# Patient Record
Sex: Male | Born: 1970
Health system: Southern US, Community
[De-identification: ages and names within clinical notes are randomized; demographics above are authoritative.]

## PROBLEM LIST (undated history)

## (undated) DIAGNOSIS — K219 Gastro-esophageal reflux disease without esophagitis: Secondary | ICD-10-CM

## (undated) DIAGNOSIS — Z6828 Body mass index (BMI) 28.0-28.9, adult: Secondary | ICD-10-CM

## (undated) DIAGNOSIS — S42302A Unspecified fracture of shaft of humerus, left arm, initial encounter for closed fracture: Secondary | ICD-10-CM

## (undated) HISTORY — DX: Gastro-esophageal reflux disease without esophagitis: K21.9

## (undated) HISTORY — PX: UPPER GASTROINTESTINAL ENDOSCOPY: SHX188

## (undated) HISTORY — DX: Body mass index (BMI) 28.0-28.9, adult: Z68.28

---

## 1992-07-03 DIAGNOSIS — S42302A Unspecified fracture of shaft of humerus, left arm, initial encounter for closed fracture: Secondary | ICD-10-CM

## 1992-07-03 HISTORY — DX: Unspecified fracture of shaft of humerus, left arm, initial encounter for closed fracture: S42.302A

## 2005-11-13 ENCOUNTER — Encounter: Admission: RE | Admit: 2005-11-13 | Discharge: 2005-11-13 | Payer: Self-pay | Admitting: Orthopaedic Surgery

## 2008-07-15 ENCOUNTER — Ambulatory Visit: Payer: Self-pay | Admitting: Sports Medicine

## 2008-07-15 DIAGNOSIS — M79609 Pain in unspecified limb: Secondary | ICD-10-CM | POA: Insufficient documentation
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
## 2012-09-04 DIAGNOSIS — F10959 Alcohol use, unspecified with alcohol-induced psychotic disorder, unspecified: Secondary | ICD-10-CM | POA: Insufficient documentation

## 2012-09-04 DIAGNOSIS — K219 Gastro-esophageal reflux disease without esophagitis: Secondary | ICD-10-CM | POA: Insufficient documentation

## 2012-11-04 DIAGNOSIS — K5900 Constipation, unspecified: Secondary | ICD-10-CM | POA: Insufficient documentation

## 2012-11-04 DIAGNOSIS — E785 Hyperlipidemia, unspecified: Secondary | ICD-10-CM | POA: Insufficient documentation

## 2013-12-09 ENCOUNTER — Telehealth: Payer: Self-pay

## 2013-12-09 ENCOUNTER — Encounter: Payer: Self-pay | Admitting: Gastroenterology

## 2013-12-09 NOTE — Telephone Encounter (Signed)
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  They want to see you but you are out until august with appts.  Do you want me to double book you?  Barnett Applebaum called on behalf of the pt.  The wife's work number is [phone]

## 2013-12-10 NOTE — Telephone Encounter (Signed)
Yes, already doubled for tomorrow.  Please put him on for Tuesday (AM or PM double book).  Thanks

## 2013-12-10 NOTE — Telephone Encounter (Signed)
appt has been made pt's wife is aware

## 2013-12-16 ENCOUNTER — Ambulatory Visit (INDEPENDENT_AMBULATORY_CARE_PROVIDER_SITE_OTHER): Payer: 59 | Admitting: Gastroenterology

## 2013-12-16 ENCOUNTER — Encounter: Payer: Self-pay | Admitting: Gastroenterology

## 2013-12-16 VITALS — BP 110/80 | HR 60 | Ht 71.75 in | Wt 189.5 lb

## 2013-12-16 DIAGNOSIS — R198 Other specified symptoms and signs involving the digestive system and abdomen: Secondary | ICD-10-CM

## 2013-12-16 DIAGNOSIS — R1310 Dysphagia, unspecified: Secondary | ICD-10-CM

## 2013-12-16 DIAGNOSIS — F458 Other somatoform disorders: Secondary | ICD-10-CM

## 2013-12-16 DIAGNOSIS — R0989 Other specified symptoms and signs involving the circulatory and respiratory systems: Secondary | ICD-10-CM

## 2013-12-16 MED ORDER — PANTOPRAZOLE SODIUM 40 MG PO TBEC: 40.0000 mg | DELAYED_RELEASE_TABLET | Freq: Two times a day (BID) | ORAL | Status: DC

## 2013-12-16 MED ORDER — PANTOPRAZOLE SODIUM 40 MG PO TBEC: 40.0000 mg | DELAYED_RELEASE_TABLET | Freq: Every day | ORAL | Status: DC

## 2013-12-16 NOTE — Patient Instructions (Signed)
New prescription for protonix twice daily (20-30 min before BF and dinner meals). You will be set up for an upper endoscopy at [facility] (moderate sedation OK) for odynophagia, dysphagia, glubus.

## 2013-12-16 NOTE — Progress Notes (Signed)
HPI: This is a  very pleasant 43 year old man whom I am meeting for the first time today. His wife works as a Chief Executive Officer for the breast cancer program here in town.   Lump in throat, globus.  Discomfort.  Some short pains.  Has been about 4 months.  No NSAIDs.  No dysphagia at all.  Weight has been stable.  Has been taking nexium for years for pyrosis, stopped and had rebound symptoms.  Takes tums periodically.    Pyrosis, sour taste in his mouth in AMs especially.  About a month started 20mg  nexium in AM, usually before BF.  Not much caffeine, except in past month or so.  2 drinks daily.  Currently not smoking.  He has not been on any antibiotics     Review of systems: Pertinent positive and negative review of systems were noted in the above HPI section. Complete review of systems was performed and was otherwise normal.    Past Medical History  Diagnosis Date  . GERD (gastroesophageal reflux disease)     History reviewed. No pertinent past surgical history.  Current Outpatient Prescriptions  Medication Sig Dispense Refill  . calcium carbonate (TUMS EX) 750 MG chewable tablet Chew 1 tablet by mouth as needed for heartburn.      . Esomeprazole Magnesium (NEXIUM 24HR PO) Take 20 mg by mouth daily.      . IODINE STRONG, LUGOLS, PO Take 2 mLs by mouth daily.      . Multiple Vitamin (MULTIVITAMIN) tablet Take 1 tablet by mouth daily.       No current facility-administered medications for this visit.    Allergies as of 12/16/2013  . (No Known Allergies)    Family History  Problem Relation Age of Onset  . Cervical cancer Mother   . Stroke Paternal Grandmother   . Lung cancer Paternal Grandfather   . Heart disease Maternal Grandmother   . Stroke Maternal Grandfather     History   Social History  . Marital Status: Married    Spouse Name: N/A    Number of Children: 2  . Years of Education: N/A   Occupational History  . HR Manager    Social  History Main Topics  . Smoking status: Former Smoker    Types: Cigarettes    Quit date: 10/05/2013  . Smokeless tobacco: Never Used  . Alcohol Use: Yes     Comment: 2 per day  . Drug Use: No  . Sexual Activity: Not on file   Other Topics Concern  . Not on file   Social History Narrative  . No narrative on file       Physical Exam: BP 110/80  Pulse 60  Ht 5' 11.75" (1.822 m)  Wt 189 lb 8 oz (85.957 kg)  BMI 25.89 kg/m2 Constitutional: generally well-appearing Psychiatric: alert and oriented x3 Eyes: extraocular movements intact Mouth: oral pharynx moist, no lesions; he has large bilateral tonsils Neck: supple no lymphadenopathy Cardiovascular: heart regular rate and rhythm Lungs: clear to auscultation bilaterally Abdomen: soft, nontender, nondistended, no obvious ascites, no peritoneal signs, normal bowel sounds Extremities: no lower extremity edema bilaterally Skin: no lesions on visible extremities    Assessment and plan: 43 y.o. male with  4 months globus, odynophagia, chronic heartburn   he really has no dysphagia and no weight loss. My suspicion for neoplasm is low. I suspect this is GERD related. He is going to start twice-daily proton pump inhibitor for now. We will proceed with  EGD later this week as well. If this is indeed GERD related; B2 keep a month twice daily for 4-6 weeks and then hopefully back down to once daily proton pump inhibitor.

## 2013-12-18 ENCOUNTER — Encounter (HOSPITAL_COMMUNITY): Payer: Self-pay

## 2013-12-18 ENCOUNTER — Ambulatory Visit (HOSPITAL_COMMUNITY)
Admission: RE | Admit: 2013-12-18 | Discharge: 2013-12-18 | Disposition: A | Discharge status: Home / Self Care | Payer: 59 | Source: Ambulatory Visit | Attending: Gastroenterology | Admitting: Gastroenterology

## 2013-12-18 ENCOUNTER — Encounter (HOSPITAL_COMMUNITY)
Admission: RE | Disposition: A | Discharge status: Home / Self Care | Payer: Self-pay | Source: Ambulatory Visit | Attending: Gastroenterology

## 2013-12-18 DIAGNOSIS — R0989 Other specified symptoms and signs involving the circulatory and respiratory systems: Secondary | ICD-10-CM

## 2013-12-18 DIAGNOSIS — F458 Other somatoform disorders: Secondary | ICD-10-CM | POA: Insufficient documentation

## 2013-12-18 DIAGNOSIS — R198 Other specified symptoms and signs involving the digestive system and abdomen: Secondary | ICD-10-CM

## 2013-12-18 DIAGNOSIS — R1310 Dysphagia, unspecified: Secondary | ICD-10-CM

## 2013-12-18 DIAGNOSIS — R12 Heartburn: Secondary | ICD-10-CM | POA: Insufficient documentation

## 2013-12-18 DIAGNOSIS — Z87891 Personal history of nicotine dependence: Secondary | ICD-10-CM | POA: Insufficient documentation

## 2013-12-18 DIAGNOSIS — Z79899 Other long term (current) drug therapy: Secondary | ICD-10-CM | POA: Insufficient documentation

## 2013-12-18 DIAGNOSIS — J351 Hypertrophy of tonsils: Secondary | ICD-10-CM | POA: Insufficient documentation

## 2013-12-18 DIAGNOSIS — K219 Gastro-esophageal reflux disease without esophagitis: Secondary | ICD-10-CM | POA: Insufficient documentation

## 2013-12-18 HISTORY — PX: ESOPHAGOGASTRODUODENOSCOPY: SHX5428

## 2013-12-18 HISTORY — DX: Unspecified fracture of shaft of humerus, left arm, initial encounter for closed fracture: S42.302A

## 2013-12-18 SURGERY — EGD (ESOPHAGOGASTRODUODENOSCOPY)
Anesthesia: Moderate Sedation

## 2013-12-18 MED ORDER — BUTAMBEN-TETRACAINE-BENZOCAINE 2-2-14 % EX AERO
INHALATION_SPRAY | CUTANEOUS | Status: DC | PRN
  Administered 2013-12-18: 2 via TOPICAL

## 2013-12-18 MED ORDER — FENTANYL CITRATE 0.05 MG/ML IJ SOLN
INTRAMUSCULAR | Status: AC
  Filled 2013-12-18: qty 2

## 2013-12-18 MED ORDER — MIDAZOLAM HCL 10 MG/2ML IJ SOLN
INTRAMUSCULAR | Status: AC
  Filled 2013-12-18: qty 2

## 2013-12-18 MED ORDER — SODIUM CHLORIDE 0.9 % IV SOLN
INTRAVENOUS | Status: DC
  Administered 2013-12-18: 14:00:00 via INTRAVENOUS

## 2013-12-18 MED ORDER — MIDAZOLAM HCL 10 MG/2ML IJ SOLN
INTRAMUSCULAR | Status: DC | PRN
  Administered 2013-12-18 (×3): 2 mg via INTRAVENOUS
  Administered 2013-12-18: 1.5 mg via INTRAVENOUS

## 2013-12-18 MED ORDER — FENTANYL CITRATE 0.05 MG/ML IJ SOLN
INTRAMUSCULAR | Status: DC | PRN
  Administered 2013-12-18 (×2): 25 ug via INTRAVENOUS
  Administered 2013-12-18: 15 ug via INTRAVENOUS
  Administered 2013-12-18: 10 ug via INTRAVENOUS

## 2013-12-18 MED ORDER — DIPHENHYDRAMINE HCL 50 MG/ML IJ SOLN
INTRAMUSCULAR | Status: AC
  Filled 2013-12-18: qty 1

## 2013-12-18 NOTE — Discharge Instructions (Signed)
Esophagogastroduodenoscopy °Care After °Refer to this sheet in the next few weeks. These instructions provide you with information on caring for yourself after your procedure. Your caregiver may also give you more specific instructions. Your treatment has been planned according to current medical practices, but problems sometimes occur. Call your caregiver if you have any problems or questions after your procedure.  °HOME CARE INSTRUCTIONS °· Do not eat or drink anything until the numbing medicine (local anesthetic) has worn off and your gag reflex has returned. You will know that the local anesthetic has worn off when you can swallow comfortably. °· Do not drive for 12 hours after the procedure or as directed by your caregiver. °· Only take medicines as directed by your caregiver. °SEEK MEDICAL CARE IF:  °· You cannot stop coughing. °· You are not urinating at all or less than usual. °SEEK IMMEDIATE MEDICAL CARE IF: °· You have difficulty swallowing. °· You cannot eat or drink. °· You have worsening throat or chest pain. °· You have dizziness, lightheadedness, or you faint. °· You have nausea or vomiting. °· You have chills. °· You have a fever. °· You have severe abdominal pain. °· You have black, tarry, or bloody stools. °Document Released: 06/05/2012 Document Reviewed: 06/05/2012 °ExitCare® Patient Information ©2015 [employer]. This information is not intended to replace advice given to you by your health care provider. Make sure you discuss any questions you have with your health care provider. ° ° °Conscious Sedation, Adult, Care After °Refer to this sheet in the next few weeks. These instructions provide you with information on caring for yourself after your procedure. Your health care provider may also give you more specific instructions. Your treatment has been planned according to current medical practices, but problems sometimes occur. Call your health care provider if you have any problems or  questions after your procedure. °WHAT TO EXPECT AFTER THE PROCEDURE  °After your procedure: °· You may feel sleepy, clumsy, and have poor balance for several hours. °· Vomiting may occur if you eat too soon after the procedure. °HOME CARE INSTRUCTIONS °· Do not participate in any activities where you could become injured for at least 24 hours. Do not: °¨ Drive. °¨ Swim. °¨ Ride a bicycle. °¨ Operate heavy machinery. °¨ Cook. °¨ Use power tools. °¨ Climb ladders. °¨ Work from a high place. °· Do not make important decisions or sign legal documents until you are improved. °· If you vomit, drink water, juice, or soup when you can drink without vomiting. Make sure you have little or no nausea before eating solid foods. °· Only take over-the-counter or prescription medicines for pain, discomfort, or fever as directed by your health care provider. °· Make sure you and your family fully understand everything about the medicines given to you, including what side effects may occur. °· You should not drink alcohol, take sleeping pills, or take medicines that cause drowsiness for at least 24 hours. °· If you smoke, do not smoke without supervision. °· If you are feeling better, you may resume normal activities 24 hours after you were sedated. °· Keep all appointments with your health care provider. °SEEK MEDICAL CARE IF: °· Your skin is pale or bluish in color. °· You continue to feel nauseous or vomit. °· Your pain is getting worse and is not helped by medicine. °· You have bleeding or swelling. °· You are still sleepy or feeling clumsy after 24 hours. °SEEK IMMEDIATE MEDICAL CARE IF: °· You develop a   rash. °· You have difficulty breathing. °· You develop any type of allergic problem. °· You have a fever. °MAKE SURE YOU: °· Understand these instructions. °· Will watch your condition. °· Will get help right away if you are not doing well or get worse. °Document Released: 04/09/2013 Document Reviewed: 04/09/2013 °ExitCare®  Patient Information ©2015 [employer]. This information is not intended to replace advice given to you by your health care provider. Make sure you discuss any questions you have with your health care provider. ° °

## 2013-12-18 NOTE — Op Note (Addendum)
[facility] [place], [address]   ENDOSCOPY PROCEDURE REPORT  PATIENT: Christopher, Osborne  MR#: [id number] BIRTHDATE: 01-08-1971 , 43  yrs. old GENDER: Male ENDOSCOPIST: Milus Banister, MD PROCEDURE DATE:  12/18/2013 PROCEDURE:  EGD, diagnostic ASA CLASS:     Class I INDICATIONS:  globus, odynophagia. MEDICATIONS: Fentanyl 75 mcg IV and Versed 7 mg IV TOPICAL ANESTHETIC: Cetacaine Spray  DESCRIPTION OF PROCEDURE: After the risks benefits and alternatives of the procedure were thoroughly explained, informed consent was obtained.  The Pentax Gastroscope [id number] endoscope was introduced through the mouth and advanced to the second portion of the duodenum. Without limitations.  The instrument was slowly withdrawn as the mucosa was fully examined.    The upper, middle and distal third of the esophagus were carefully inspected and no abnormalities were noted.  The z-line was well seen at the GEJ.  The endoscope was pushed into the fundus which was normal including a retroflexed view.  The antrum, gastric body, first and second part of the duodenum were unremarkable.  Large tonsils bilaterally in oropharynx.          The scope was then withdrawn from the patient and the procedure completed. COMPLICATIONS: There were no complications.  ENDOSCOPIC IMPRESSION: Normal EGD Very large tonsils  RECOMMENDATIONS: Please continue twice daily PPI (protonix) for 4 weeks.  Call my office to report on your response towards the end of that time.  If improving, would likely decrease to once daily.  If no real improvement, then will consider ENT referral.  Your tonsils are very large and perhaps they are starting to cause feeling of lump in throat.   eSigned:  Milus Banister, MD 12/18/2013 3:06 PM Revised: 12/18/2013 3:06 PM

## 2013-12-18 NOTE — Interval H&P Note (Signed)
History and Physical Interval Note:  12/18/2013 2:28 PM  Christopher Osborne  has presented today for surgery, with the diagnosis of Odynophagia [787.20] Globus sensation [306.4]  The various methods of treatment have been discussed with the patient and family. After consideration of risks, benefits and other options for treatment, the patient has consented to  Procedure(s): ESOPHAGOGASTRODUODENOSCOPY (EGD) (N/A) as a surgical intervention .  The patient's history has been reviewed, patient examined, no change in status, stable for surgery.  I have reviewed the patient's chart and labs.  Questions were answered to the patient's satisfaction.     Milus Banister

## 2013-12-18 NOTE — H&P (View-Only) (Signed)
HPI: This is a  very pleasant 43 year old man whom I am meeting for the first time today. His wife works as a Chief Executive Officer for the breast cancer program here in town.   Lump in throat, globus.  Discomfort.  Some short pains.  Has been about 4 months.  No NSAIDs.  No dysphagia at all.  Weight has been stable.  Has been taking nexium for years for pyrosis, stopped and had rebound symptoms.  Takes tums periodically.    Pyrosis, sour taste in his mouth in AMs especially.  About a month started 20mg  nexium in AM, usually before BF.  Not much caffeine, except in past month or so.  2 drinks daily.  Currently not smoking.  He has not been on any antibiotics     Review of systems: Pertinent positive and negative review of systems were noted in the above HPI section. Complete review of systems was performed and was otherwise normal.    Past Medical History  Diagnosis Date  . GERD (gastroesophageal reflux disease)     History reviewed. No pertinent past surgical history.  Current Outpatient Prescriptions  Medication Sig Dispense Refill  . calcium carbonate (TUMS EX) 750 MG chewable tablet Chew 1 tablet by mouth as needed for heartburn.      . Esomeprazole Magnesium (NEXIUM 24HR PO) Take 20 mg by mouth daily.      . IODINE STRONG, LUGOLS, PO Take 2 mLs by mouth daily.      . Multiple Vitamin (MULTIVITAMIN) tablet Take 1 tablet by mouth daily.       No current facility-administered medications for this visit.    Allergies as of 12/16/2013  . (No Known Allergies)    Family History  Problem Relation Age of Onset  . Cervical cancer Mother   . Stroke Paternal Grandmother   . Lung cancer Paternal Grandfather   . Heart disease Maternal Grandmother   . Stroke Maternal Grandfather     History   Social History  . Marital Status: Married    Spouse Name: N/A    Number of Children: 2  . Years of Education: N/A   Occupational History  . HR Manager    Social  History Main Topics  . Smoking status: Former Smoker    Types: Cigarettes    Quit date: 10/05/2013  . Smokeless tobacco: Never Used  . Alcohol Use: Yes     Comment: 2 per day  . Drug Use: No  . Sexual Activity: Not on file   Other Topics Concern  . Not on file   Social History Narrative  . No narrative on file       Physical Exam: BP 110/80  Pulse 60  Ht 5' 11.75" (1.822 m)  Wt 189 lb 8 oz (85.957 kg)  BMI 25.89 kg/m2 Constitutional: generally well-appearing Psychiatric: alert and oriented x3 Eyes: extraocular movements intact Mouth: oral pharynx moist, no lesions; he has large bilateral tonsils Neck: supple no lymphadenopathy Cardiovascular: heart regular rate and rhythm Lungs: clear to auscultation bilaterally Abdomen: soft, nontender, nondistended, no obvious ascites, no peritoneal signs, normal bowel sounds Extremities: no lower extremity edema bilaterally Skin: no lesions on visible extremities    Assessment and plan: 43 y.o. male with  4 months globus, odynophagia, chronic heartburn   he really has no dysphagia and no weight loss. My suspicion for neoplasm is low. I suspect this is GERD related. He is going to start twice-daily proton pump inhibitor for now. We will proceed with  EGD later this week as well. If this is indeed GERD related; B2 keep a month twice daily for 4-6 weeks and then hopefully back down to once daily proton pump inhibitor.

## 2013-12-19 ENCOUNTER — Encounter (HOSPITAL_COMMUNITY): Payer: Self-pay | Admitting: Gastroenterology

## 2014-02-09 ENCOUNTER — Ambulatory Visit: Payer: Self-pay | Admitting: Gastroenterology

## 2015-09-02 DIAGNOSIS — L218 Other seborrheic dermatitis: Secondary | ICD-10-CM | POA: Diagnosis not present

## 2015-09-02 DIAGNOSIS — D225 Melanocytic nevi of trunk: Secondary | ICD-10-CM | POA: Diagnosis not present

## 2015-09-02 DIAGNOSIS — D2272 Melanocytic nevi of left lower limb, including hip: Secondary | ICD-10-CM | POA: Diagnosis not present

## 2015-09-02 DIAGNOSIS — D2262 Melanocytic nevi of left upper limb, including shoulder: Secondary | ICD-10-CM | POA: Diagnosis not present

## 2015-09-02 DIAGNOSIS — L918 Other hypertrophic disorders of the skin: Secondary | ICD-10-CM | POA: Diagnosis not present

## 2015-09-02 DIAGNOSIS — L814 Other melanin hyperpigmentation: Secondary | ICD-10-CM | POA: Diagnosis not present

## 2015-09-02 MED FILL — KETOCONAZOLE 2% SHAMPOO: 2 | 30 days supply | Qty: 120 | Fill #0

## 2015-11-10 DIAGNOSIS — S026 Fracture of unspecified part of body of mandible: Secondary | ICD-10-CM | POA: Diagnosis not present

## 2015-11-10 DIAGNOSIS — S02600A Fracture of unspecified part of body of mandible, initial encounter for closed fracture: Secondary | ICD-10-CM | POA: Diagnosis not present

## 2016-01-31 ENCOUNTER — Ambulatory Visit
Admission: EM | Admit: 2016-01-31 | Discharge: 2016-01-31 | Disposition: A | Discharge status: Home / Self Care | Payer: 59 | Attending: Family Medicine | Admitting: Family Medicine

## 2016-01-31 ENCOUNTER — Ambulatory Visit (INDEPENDENT_AMBULATORY_CARE_PROVIDER_SITE_OTHER): Payer: 59

## 2016-01-31 DIAGNOSIS — S61219A Laceration without foreign body of unspecified finger without damage to nail, initial encounter: Secondary | ICD-10-CM | POA: Diagnosis not present

## 2016-01-31 DIAGNOSIS — S61215A Laceration without foreign body of left ring finger without damage to nail, initial encounter: Secondary | ICD-10-CM | POA: Diagnosis not present

## 2016-01-31 MED ORDER — CEPHALEXIN 500 MG PO CAPS: 500.0000 mg | ORAL_CAPSULE | Freq: Four times a day (QID) | ORAL | 0 refills | Status: DC

## 2016-01-31 MED ORDER — TETANUS-DIPHTH-ACELL PERTUSSIS 5-2.5-18.5 LF-MCG/0.5 IM SUSP
0.5000 mL | Freq: Once | INTRAMUSCULAR | Status: AC
  Administered 2016-01-31: 0.5 mL via INTRAMUSCULAR

## 2016-01-31 MED FILL — CEPHALEXIN 500 MG CAPSULE: 500 | 5 days supply | Qty: 20 | Fill #0

## 2016-01-31 NOTE — ED Provider Notes (Signed)
[facility] ____________________________________________  Time seen: Approximately 2:15 PM  I have reviewed the triage vital signs and the nursing notes.   HISTORY  Chief Complaint Laceration (Left middle and ring finger)  HPI Christopher CRUZE is a 45 y.o. male  presents for evaluation of laceration to left third and fourth fingers sustained just prior to arrival. Patient reports this occurred at a friend's house. Patient reports that he was helping a friend put up a piece of stainless steel. . Patient reports the stainless steel had recently been cut and was warm to touch. Patient reports the piece of stainless steel slid down along his third and fourth fingers causing laceration. Denies crush injury. Denies any other pain or injury. Patient reports last tetanus immunization was questionably 5 or 6 years ago and states he is not fully sure.  States pain to the third and fourth fingers are mild to moderate and mostly with direct palpation. Denies concerns of foreign bodies. Denies any other pain or injury. Denies fall to the ground. Denies any Worker's Compensation injury. Reports right hand dominant.   Past Medical History:  Diagnosis Date  . Arm fracture, left 1994  . GERD (gastroesophageal reflux disease)     Patient Active Problem List   Diagnosis Date Noted  . FOOT PAIN, RIGHT 07/15/2008    Past Surgical History:  Procedure Laterality Date  . ESOPHAGOGASTRODUODENOSCOPY N/A 12/18/2013   Procedure: ESOPHAGOGASTRODUODENOSCOPY (EGD);  Surgeon: Milus Banister, MD;  Location: [facility];  Service: Endoscopy;  Laterality: N/A;   No current facility-administered medications for this encounter.   Current Outpatient Prescriptions:  .  calcium carbonate (TUMS EX) 750 MG chewable tablet, Chew 1 tablet by mouth as needed for heartburn., Disp: , Rfl:  .  Multiple Vitamin (MULTIVITAMIN) tablet, Take 1 tablet by mouth daily., Disp: , Rfl:  .  cephALEXin (KEFLEX) 500 MG  capsule, Take 1 capsule (500 mg total) by mouth 4 (four) times daily. For 5 days, Disp: 20 capsule, Rfl: 0 .  Esomeprazole Magnesium (NEXIUM 24HR PO), Take 20 mg by mouth daily., Disp: , Rfl:  .  IODINE STRONG, LUGOLS, PO, Take 2 mLs by mouth daily., Disp: , Rfl:  .  pantoprazole (PROTONIX) 40 MG tablet, Take 1 tablet (40 mg total) by mouth 2 (two) times daily., Disp: 90 tablet, Rfl: 3  Allergies Review of patient's allergies indicates no known allergies.  Family History  Problem Relation Age of Onset  . Cervical cancer Mother   . Stroke Paternal Grandmother   . Lung cancer Paternal Grandfather   . Heart disease Maternal Grandmother   . Stroke Maternal Grandfather     Social History Social History  Substance Use Topics  . Smoking status: Former Smoker    Types: Cigarettes    Quit date: 10/05/2013  . Smokeless tobacco: Never Used  . Alcohol use Yes     Comment: 2 per day    Review of Systems Constitutional: No fever/chills Eyes: No visual changes. ENT: No sore throat. Cardiovascular: Denies chest pain. Respiratory: Denies shortness of breath. Gastrointestinal: No abdominal pain.  No nausea, no vomiting.  No diarrhea.  No constipation. Genitourinary: Negative for dysuria. Musculoskeletal: Negative for back pain. Skin: Negative for rash. Neurological: Negative for headaches, focal weakness or numbness.  10-point ROS otherwise negative.  ____________________________________________   PHYSICAL EXAM:  VITAL SIGNS: ED Triage Vitals [01/31/16 1252]  Enc Vitals Group     BP (!) 154/93     Pulse Rate 83  Resp 18     Temp 98.5 F (36.9 C)     Temp Source Oral     SpO2 99 %     Weight 185 lb (83.9 kg)     Height 5\' 11"  (1.803 m)     Head Circumference      Peak Flow      Pain Score 5     Pain Loc      Pain Edu?      Excl. in [place]?     Constitutional: Alert and oriented. Well appearing and in no acute distress. Eyes: Conjunctivae are normal. PERRL. EOMI. ENT       Head: Normocephalic and atraumatic.      Nose: No congestion/rhinnorhea.      Mouth/Throat: Mucous membranes are moist. Cardiovascular: Normal rate, regular rhythm. Grossly normal heart sounds.  Good peripheral circulation. Respiratory: Normal respiratory effort without tachypnea nor retractions. Breath sounds are clear and equal bilaterally. No wheezes/rales/rhonchi.. Musculoskeletal:  Nontender with normal range of motion in all extremities. See skin below. Neurologic:  Normal speech and language. No gross focal neurologic deficits are appreciated. Speech is normal. No gait instability.  Skin:  Skin is warm, dry and intact. No rash noted. Except: Left fourth digit palmar aspect superficial skin avulsion 1.5 cm, mild tenderness to palpation, no foreign body visible, full range of motion, no motor or tendon deficit, sensation intact with normal capillary refill. No repair indicated. Except: Left third digit palmar aspect laceration present that is 2.5 cm superficial flap laceration and superficial abrasion, mild to moderate tenderness to palpation, no foreign body visible or palpated, full range of motion, no motor or tendon deficit, sensation intact with normal capillary refill.  Psychiatric: Mood and affect are normal. Speech and behavior are normal. Patient exhibits appropriate insight and judgment   ___________________________________________   LABS (all labs ordered are listed, but only abnormal results are displayed)  Labs Reviewed - No data to display  RADIOLOGY  Dg Hand Complete Left  Result Date: 01/31/2016 CLINICAL DATA:  Laceration of third and fourth fingers. EXAM: LEFT HAND - COMPLETE 3+ VIEW COMPARISON:  None. FINDINGS: There is no evidence of fracture or dislocation. There is no evidence of arthropathy or other focal bone abnormality. Soft tissues are unremarkable. IMPRESSION: Negative. Electronically Signed   By: Dorise Bullion III M.D   On: 01/31/2016 14:21    ____________________________________________   PROCEDURES Procedures  Procedure(s) performed:  Procedure explained and verbal consent obtained. Consent: Verbal consent obtained. Written consent not obtained. Risks and benefits: risks, benefits and alternatives were discussed Patient identity confirmed: verbally with patient and hospital-assigned identification number  Consent given by: patient   Laceration Repair Location: Left third digit Length: 2.5 cm Foreign bodies: no foreign bodies Tendon involvement: none Nerve involvement: none Preparation: Patient was prepped and draped in the usual sterile fashion. Anesthesia none Irrigation solution: saline and Betadine  Irrigation method: jet lavage Amount of cleaning: copious Repaired with 2 Steri-Strips. Surgical adhesive utilized to secure Steri-Strips. Sterile adhesive not applied directly to the wound. Approximation: loose Patient tolerate well. Wound well approximated post repair.  Antibiotic ointment and dressing applied.  Wound care instructions provided.  Observe for any signs of infection or other problems.    _________________________________________   INITIAL IMPRESSION / ASSESSMENT AND PLAN / ED COURSE  Pertinent labs & imaging results that were available during my care of the patient were reviewed by me and considered in my medical decision making (see chart for details).  Very well-appearing patient. No acute distress. Laceration to third and fourth digits post injury just prior to arrival. Left hand x-ray negative. Left third digit laceration no sutures indicated, repaired with Steri-Strips as above. Left fourth digit laceration repair indicated. Tetanus immunization updated. Dressing applied. Counseled regarding wound care, cleaning and monitoring at home. Discussed in detail with patient as well as a dirty object, will give Rx for oral cephalexin.Discussed indication, risks and benefits of medications with  patient. Discussed return indications.  Discussed follow up with Primary care physician this week. Discussed follow up and return parameters including no resolution or any worsening concerns. Patient verbalized understanding and agreed to plan.   ____________________________________________   FINAL CLINICAL IMPRESSION(S) / ED DIAGNOSES  Final diagnoses:  Finger laceration, initial encounter     Discharge Medication List as of 01/31/2016  2:30 PM    START taking these medications   Details  cephALEXin (KEFLEX) 500 MG capsule Take 1 capsule (500 mg total) by mouth 4 (four) times daily. For 5 days, Starting Mon 01/31/2016, Normal        Note: This dictation was prepared with Dragon dictation along with smaller phrase technology. Any transcriptional errors that result from this process are unintentional.    Clinical Course      Marylene Land, NP 01/31/16 [phone]

## 2016-01-31 NOTE — ED Triage Notes (Signed)
Patient presents with a laceration to his middle and ring , he was helping a friend with stainless steel and it fell and slid down his fingers and he states it burns.

## 2016-05-17 DIAGNOSIS — Z125 Encounter for screening for malignant neoplasm of prostate: Secondary | ICD-10-CM | POA: Diagnosis not present

## 2016-05-17 DIAGNOSIS — Z0000 Encounter for general adult medical examination without abnormal findings: Secondary | ICD-10-CM | POA: Diagnosis not present

## 2016-05-23 DIAGNOSIS — M5126 Other intervertebral disc displacement, lumbar region: Secondary | ICD-10-CM | POA: Diagnosis not present

## 2016-05-23 DIAGNOSIS — M9903 Segmental and somatic dysfunction of lumbar region: Secondary | ICD-10-CM | POA: Diagnosis not present

## 2016-05-23 DIAGNOSIS — M791 Myalgia: Secondary | ICD-10-CM | POA: Diagnosis not present

## 2016-05-23 DIAGNOSIS — M9904 Segmental and somatic dysfunction of sacral region: Secondary | ICD-10-CM | POA: Diagnosis not present

## 2016-05-23 DIAGNOSIS — M5442 Lumbago with sciatica, left side: Secondary | ICD-10-CM | POA: Diagnosis not present

## 2016-05-23 DIAGNOSIS — M9905 Segmental and somatic dysfunction of pelvic region: Secondary | ICD-10-CM | POA: Diagnosis not present

## 2016-05-31 DIAGNOSIS — M9905 Segmental and somatic dysfunction of pelvic region: Secondary | ICD-10-CM | POA: Diagnosis not present

## 2016-05-31 DIAGNOSIS — M5126 Other intervertebral disc displacement, lumbar region: Secondary | ICD-10-CM | POA: Diagnosis not present

## 2016-05-31 DIAGNOSIS — M791 Myalgia: Secondary | ICD-10-CM | POA: Diagnosis not present

## 2016-05-31 DIAGNOSIS — M9904 Segmental and somatic dysfunction of sacral region: Secondary | ICD-10-CM | POA: Diagnosis not present

## 2016-05-31 DIAGNOSIS — M5442 Lumbago with sciatica, left side: Secondary | ICD-10-CM | POA: Diagnosis not present

## 2016-05-31 DIAGNOSIS — M9903 Segmental and somatic dysfunction of lumbar region: Secondary | ICD-10-CM | POA: Diagnosis not present

## 2016-06-01 DIAGNOSIS — M545 Low back pain, unspecified: Secondary | ICD-10-CM | POA: Insufficient documentation

## 2016-06-01 DIAGNOSIS — Z23 Encounter for immunization: Secondary | ICD-10-CM | POA: Diagnosis not present

## 2016-06-01 DIAGNOSIS — E784 Other hyperlipidemia: Secondary | ICD-10-CM | POA: Diagnosis not present

## 2016-06-01 DIAGNOSIS — Z6825 Body mass index (BMI) 25.0-25.9, adult: Secondary | ICD-10-CM | POA: Diagnosis not present

## 2016-06-01 DIAGNOSIS — Z1389 Encounter for screening for other disorder: Secondary | ICD-10-CM | POA: Diagnosis not present

## 2016-06-01 DIAGNOSIS — Z720 Tobacco use: Secondary | ICD-10-CM | POA: Diagnosis not present

## 2016-06-01 DIAGNOSIS — K219 Gastro-esophageal reflux disease without esophagitis: Secondary | ICD-10-CM | POA: Diagnosis not present

## 2016-06-01 DIAGNOSIS — Z0000 Encounter for general adult medical examination without abnormal findings: Secondary | ICD-10-CM | POA: Diagnosis not present

## 2016-06-01 DIAGNOSIS — Z125 Encounter for screening for malignant neoplasm of prostate: Secondary | ICD-10-CM | POA: Diagnosis not present

## 2016-06-01 MED FILL — predniSONE 5 MG (21) TBPK: 5 | 6 days supply | Qty: 21 | Fill #0

## 2016-06-02 DIAGNOSIS — M9904 Segmental and somatic dysfunction of sacral region: Secondary | ICD-10-CM | POA: Diagnosis not present

## 2016-06-02 DIAGNOSIS — M5126 Other intervertebral disc displacement, lumbar region: Secondary | ICD-10-CM | POA: Diagnosis not present

## 2016-06-02 DIAGNOSIS — M5442 Lumbago with sciatica, left side: Secondary | ICD-10-CM | POA: Diagnosis not present

## 2016-06-02 DIAGNOSIS — M9903 Segmental and somatic dysfunction of lumbar region: Secondary | ICD-10-CM | POA: Diagnosis not present

## 2016-06-02 DIAGNOSIS — M791 Myalgia: Secondary | ICD-10-CM | POA: Diagnosis not present

## 2016-06-02 DIAGNOSIS — M9905 Segmental and somatic dysfunction of pelvic region: Secondary | ICD-10-CM | POA: Diagnosis not present

## 2016-06-07 ENCOUNTER — Other Ambulatory Visit: Payer: Self-pay | Admitting: Internal Medicine

## 2016-06-07 DIAGNOSIS — M9903 Segmental and somatic dysfunction of lumbar region: Secondary | ICD-10-CM | POA: Diagnosis not present

## 2016-06-07 DIAGNOSIS — M5442 Lumbago with sciatica, left side: Secondary | ICD-10-CM | POA: Diagnosis not present

## 2016-06-07 DIAGNOSIS — M791 Myalgia: Secondary | ICD-10-CM | POA: Diagnosis not present

## 2016-06-07 DIAGNOSIS — M9904 Segmental and somatic dysfunction of sacral region: Secondary | ICD-10-CM | POA: Diagnosis not present

## 2016-06-07 DIAGNOSIS — M9905 Segmental and somatic dysfunction of pelvic region: Secondary | ICD-10-CM | POA: Diagnosis not present

## 2016-06-07 DIAGNOSIS — M5126 Other intervertebral disc displacement, lumbar region: Secondary | ICD-10-CM | POA: Diagnosis not present

## 2016-06-08 MED FILL — CEFDINIR 300 MG CAPSULE: 300 | 7 days supply | Qty: 14 | Fill #0

## 2016-06-09 ENCOUNTER — Other Ambulatory Visit (HOSPITAL_COMMUNITY): Payer: Self-pay | Admitting: Internal Medicine

## 2016-06-12 ENCOUNTER — Other Ambulatory Visit (HOSPITAL_COMMUNITY): Payer: Self-pay | Admitting: Internal Medicine

## 2016-06-12 DIAGNOSIS — M545 Low back pain, unspecified: Secondary | ICD-10-CM

## 2016-06-16 ENCOUNTER — Ambulatory Visit (HOSPITAL_COMMUNITY): Payer: 59

## 2016-07-28 DIAGNOSIS — J111 Influenza due to unidentified influenza virus with other respiratory manifestations: Secondary | ICD-10-CM | POA: Diagnosis not present

## 2016-07-28 DIAGNOSIS — R05 Cough: Secondary | ICD-10-CM | POA: Diagnosis not present

## 2016-09-04 ENCOUNTER — Ambulatory Visit (HOSPITAL_COMMUNITY)
Admission: RE | Admit: 2016-09-04 | Discharge: 2016-09-04 | Disposition: A | Discharge status: Home / Self Care | Payer: 59 | Source: Ambulatory Visit | Attending: Internal Medicine | Admitting: Internal Medicine

## 2016-09-04 DIAGNOSIS — M545 Low back pain, unspecified: Secondary | ICD-10-CM

## 2016-09-04 DIAGNOSIS — M5126 Other intervertebral disc displacement, lumbar region: Secondary | ICD-10-CM | POA: Insufficient documentation

## 2016-09-04 DIAGNOSIS — M5137 Other intervertebral disc degeneration, lumbosacral region: Secondary | ICD-10-CM | POA: Insufficient documentation

## 2016-09-05 DIAGNOSIS — M48061 Spinal stenosis, lumbar region without neurogenic claudication: Secondary | ICD-10-CM | POA: Insufficient documentation

## 2016-09-12 DIAGNOSIS — Z6825 Body mass index (BMI) 25.0-25.9, adult: Secondary | ICD-10-CM | POA: Diagnosis not present

## 2016-09-12 DIAGNOSIS — M5416 Radiculopathy, lumbar region: Secondary | ICD-10-CM | POA: Diagnosis not present

## 2016-09-12 DIAGNOSIS — R030 Elevated blood-pressure reading, without diagnosis of hypertension: Secondary | ICD-10-CM | POA: Diagnosis not present

## 2016-09-13 ENCOUNTER — Other Ambulatory Visit: Payer: Self-pay | Admitting: Student

## 2016-09-13 DIAGNOSIS — M5416 Radiculopathy, lumbar region: Secondary | ICD-10-CM

## 2016-09-19 ENCOUNTER — Ambulatory Visit
Admission: RE | Admit: 2016-09-19 | Discharge: 2016-09-19 | Disposition: A | Discharge status: Home / Self Care | Payer: 59 | Source: Ambulatory Visit | Attending: Student | Admitting: Student

## 2016-09-19 DIAGNOSIS — M48061 Spinal stenosis, lumbar region without neurogenic claudication: Secondary | ICD-10-CM | POA: Diagnosis not present

## 2016-09-19 DIAGNOSIS — M5416 Radiculopathy, lumbar region: Secondary | ICD-10-CM

## 2016-09-19 MED ORDER — METHYLPREDNISOLONE ACETATE 40 MG/ML INJ SUSP (RADIOLOG
120.0000 mg | Freq: Once | INTRAMUSCULAR | Status: AC
  Administered 2016-09-19: 120 mg via EPIDURAL

## 2016-09-19 MED ORDER — IOPAMIDOL (ISOVUE-M 200) INJECTION 41%
1.0000 mL | Freq: Once | INTRAMUSCULAR | Status: AC
  Administered 2016-09-19: 1 mL via EPIDURAL

## 2016-09-19 NOTE — Discharge Instructions (Signed)

## 2016-10-10 DIAGNOSIS — M5416 Radiculopathy, lumbar region: Secondary | ICD-10-CM | POA: Diagnosis not present

## 2017-01-26 ENCOUNTER — Emergency Department (HOSPITAL_COMMUNITY)
Admission: EM | Admit: 2017-01-26 | Discharge: 2017-01-27 | Disposition: A | Discharge status: Home / Self Care | Payer: 59 | Attending: Emergency Medicine | Admitting: Emergency Medicine

## 2017-01-26 ENCOUNTER — Encounter (HOSPITAL_COMMUNITY): Payer: Self-pay | Admitting: Emergency Medicine

## 2017-01-26 DIAGNOSIS — Y92538 Other ambulatory health services establishments as the place of occurrence of the external cause: Secondary | ICD-10-CM | POA: Insufficient documentation

## 2017-01-26 DIAGNOSIS — Z87891 Personal history of nicotine dependence: Secondary | ICD-10-CM | POA: Insufficient documentation

## 2017-01-26 DIAGNOSIS — S80862A Insect bite (nonvenomous), left lower leg, initial encounter: Secondary | ICD-10-CM | POA: Diagnosis not present

## 2017-01-26 DIAGNOSIS — W57XXXA Bitten or stung by nonvenomous insect and other nonvenomous arthropods, initial encounter: Secondary | ICD-10-CM | POA: Diagnosis not present

## 2017-01-26 DIAGNOSIS — Y9389 Activity, other specified: Secondary | ICD-10-CM | POA: Diagnosis not present

## 2017-01-26 DIAGNOSIS — M79605 Pain in left leg: Secondary | ICD-10-CM | POA: Diagnosis not present

## 2017-01-26 DIAGNOSIS — L299 Pruritus, unspecified: Secondary | ICD-10-CM | POA: Insufficient documentation

## 2017-01-26 DIAGNOSIS — R208 Other disturbances of skin sensation: Secondary | ICD-10-CM | POA: Diagnosis present

## 2017-01-26 DIAGNOSIS — Y990 Civilian activity done for income or pay: Secondary | ICD-10-CM | POA: Insufficient documentation

## 2017-01-26 DIAGNOSIS — T7840XA Allergy, unspecified, initial encounter: Secondary | ICD-10-CM | POA: Diagnosis not present

## 2017-01-26 DIAGNOSIS — Z91038 Other insect allergy status: Secondary | ICD-10-CM | POA: Diagnosis not present

## 2017-01-26 MED ORDER — PREDNISONE 10 MG PO TABS: 40.0000 mg | ORAL_TABLET | Freq: Every day | ORAL | 0 refills | Status: AC

## 2017-01-26 MED ORDER — EPINEPHRINE 0.3 MG/0.3ML IJ SOAJ: 0.3000 mg | Freq: Once | INTRAMUSCULAR | 1 refills | Status: AC

## 2017-01-26 MED ORDER — METHYLPREDNISOLONE SODIUM SUCC 125 MG IJ SOLR
125.0000 mg | Freq: Once | INTRAMUSCULAR | Status: AC
  Administered 2017-01-26: 125 mg via INTRAVENOUS
  Filled 2017-01-26: qty 2

## 2017-01-26 MED ORDER — SODIUM CHLORIDE 0.9 % IV BOLUS (SEPSIS)
1000.0000 mL | Freq: Once | INTRAVENOUS | Status: AC
  Administered 2017-01-26: 1000 mL via INTRAVENOUS

## 2017-01-26 MED ORDER — FAMOTIDINE IN NACL 20-0.9 MG/50ML-% IV SOLN
20.0000 mg | Freq: Once | INTRAVENOUS | Status: AC
  Administered 2017-01-26: 20 mg via INTRAVENOUS
  Filled 2017-01-26: qty 50

## 2017-01-26 MED ORDER — TRAMADOL HCL 50 MG PO TABS: 50.0000 mg | ORAL_TABLET | Freq: Four times a day (QID) | ORAL | 0 refills | Status: DC | PRN

## 2017-01-26 MED ORDER — IBUPROFEN 400 MG PO TABS: 800.0000 mg | ORAL_TABLET | ORAL | Status: DC | PRN

## 2017-01-26 MED ORDER — TRAMADOL HCL 50 MG PO TABS
50.0000 mg | ORAL_TABLET | Freq: Once | ORAL | Status: AC
  Administered 2017-01-26: 50 mg via ORAL
  Filled 2017-01-26: qty 1

## 2017-01-26 NOTE — Discharge Instructions (Signed)

## 2017-01-26 NOTE — ED Notes (Signed)
Pt stung by several yellow jackets approx 1 hr ago. Pt took 2 benadryl at home but still feeling a "warm burning" sensation all over body. Also reports he feels like his tongue is swollen. Long MD aware.

## 2017-01-26 NOTE — ED Notes (Signed)
Pt verbalized understanding discharge instructions and denies any further needs or questions at this time. VS stable, ambulatory and steady gait.   

## 2017-01-26 NOTE — ED Provider Notes (Signed)
Emergency Department Provider Note   I have reviewed the triage vital signs and the nursing notes.   HISTORY  Chief Complaint Allergic Reaction   HPI MERLON ALCORTA is a 46 y.o. male with PMH of GERD presents to the emergency department for evaluation of diffuse burning sensation, itching, pain in the left leg after multiple yellowjacket stings. 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 He had several low jackets stinging him in the leg. Shortly after he developed diffuse burning sensation over his entire body with itching and hives. No difficulty breathing. Denies any tongue swelling. He did feel some itchy sensation in his tongue. No radiation of symptoms.   Past Medical History:  Diagnosis Date  . Arm fracture, left 1994  . GERD (gastroesophageal reflux disease)     Patient Active Problem List   Diagnosis Date Noted  . FOOT PAIN, RIGHT 07/15/2008    Past Surgical History:  Procedure Laterality Date  . ESOPHAGOGASTRODUODENOSCOPY N/A 12/18/2013   Procedure: ESOPHAGOGASTRODUODENOSCOPY (EGD);  Surgeon: Milus Banister, MD;  Location: [facility];  Service: Endoscopy;  Laterality: N/A;    Current Outpatient Rx  . Order #: [id number] Class: Historical Med  . Order #: [id number] Class: Normal  . Order #: [id number] Class: Print  . Order #: [id number] Class: Historical Med  . Order #: [id number] Class: Normal  . [START ON 01/27/2017] Order #: [id number] Class: Print  . Order #: [id number] Class: Print    Allergies Patient has no known allergies.  Family History  Problem Relation Age of Onset  . Cervical cancer Mother   . Stroke Paternal Grandmother   . Lung cancer Paternal Grandfather   . Heart disease Maternal Grandmother   . Stroke Maternal Grandfather     Social History Social History  Substance Use Topics  . Smoking status: Former Smoker    Types: Cigarettes    Quit date: 10/05/2013  . Smokeless tobacco: Never Used  . Alcohol  use Yes     Comment: 2 per day    Review of Systems  Constitutional: No fever/chills Eyes: No visual changes. ENT: No sore throat. Cardiovascular: Denies chest pain. Respiratory: Denies shortness of breath. Gastrointestinal: No abdominal pain.  No nausea, no vomiting.  No diarrhea.  No constipation. Genitourinary: Negative for dysuria. Musculoskeletal: Negative for back pain. Skin: Negative for rash. Neurological: Negative for headaches, focal weakness or numbness.  10-point ROS otherwise negative.  ____________________________________________   PHYSICAL EXAM:  VITAL SIGNS: ED Triage Vitals  Enc Vitals Group     BP 01/26/17 2021 (!) 136/93     Pulse Rate 01/26/17 2021 97     Resp 01/26/17 2021 18     Temp 01/26/17 2021 98.3 F (36.8 C)     Temp Source 01/26/17 2021 Oral     SpO2 01/26/17 2021 97 %     Weight 01/26/17 2020 185 lb (83.9 kg)     Height 01/26/17 2020 6' (1.829 m)     Pain Score 01/26/17 2020 7    Constitutional: Alert and oriented. Well appearing and in no acute distress. Eyes: Conjunctivae are normal.  Head: Atraumatic. Nose: No congestion/rhinnorhea. Mouth/Throat: Mucous membranes are moist.  Oropharynx non-erythematous. No PTA. No tongue swelling.  Neck: No stridor.   Cardiovascular: Normal rate, regular rhythm. Good peripheral circulation. Grossly normal heart sounds.   Respiratory: Normal respiratory effort.  No retractions. Lungs CTAB. Gastrointestinal: Soft and nontender. No distention.  Musculoskeletal: No lower extremity tenderness nor edema. No  gross deformities of extremities. Neurologic:  Normal speech and language. No gross focal neurologic deficits are appreciated.  Skin:  Skin is warm, dry and intact. No rash noted.  ____________________________________________   PROCEDURES  Procedure(s) performed:   Procedures  None  ____________________________________________   INITIAL IMPRESSION / ASSESSMENT AND PLAN / ED  COURSE  Pertinent labs & imaging results that were available during my care of the patient were reviewed by me and considered in my medical decision making (see chart for details).  Patient resents to the emergency department for evaluation of diffuse hives and pain in the leg after yellowjacket stings. No evidence of anaphylaxis at this time. Plan for steroids, IV fluid, Pepcid. Patient took 50 mg of Benadryl prior to arrival. No indication for epinephrine at this time.   11:00 PM Patient feeling much better. Plan for discharge with EpiPen, steroid burst, plan for PCP follow-up.  At this time, I do not feel there is any life-threatening condition present. I have reviewed and discussed all results (EKG, imaging, lab, urine as appropriate), exam findings with patient. I have reviewed nursing notes and appropriate previous records.  I feel the patient is safe to be discharged home without further emergent workup. Discussed usual and customary return precautions. Patient and family (if present) verbalize understanding and are comfortable with this plan.  Patient will follow-up with their primary care provider. If they do not have a primary care provider, information for follow-up has been provided to them. All questions have been answered.  ____________________________________________  FINAL CLINICAL IMPRESSION(S) / ED DIAGNOSES  Final diagnoses:  Allergic reaction, initial encounter     MEDICATIONS GIVEN DURING THIS VISIT:  Medications  traMADol (ULTRAM) tablet 50 mg (not administered)  ibuprofen (ADVIL,MOTRIN) tablet 800 mg (not administered)  sodium chloride 0.9 % bolus 1,000 mL (1,000 mLs Intravenous New Bag/Given 01/26/17 2110)  methylPREDNISolone sodium succinate (SOLU-MEDROL) 125 mg/2 mL injection 125 mg (125 mg Intravenous Given 01/26/17 2116)  famotidine (PEPCID) IVPB 20 mg premix (20 mg Intravenous New Bag/Given 01/26/17 2116)     NEW OUTPATIENT MEDICATIONS STARTED DURING THIS  VISIT:  New Prescriptions   EPINEPHRINE (EPIPEN 2-PAK) 0.3 MG/0.3 ML IJ SOAJ INJECTION    Inject 0.3 mLs (0.3 mg total) into the muscle once.   PREDNISONE (DELTASONE) 10 MG TABLET    Take 4 tablets (40 mg total) by mouth daily.   TRAMADOL (ULTRAM) 50 MG TABLET    Take 1 tablet (50 mg total) by mouth every 6 (six) hours as needed.      Note:  This document was prepared using Dragon voice recognition software and may include unintentional dictation errors.  Nanda Quinton, MD Emergency Medicine    Brylan Seubert, [facility], MD 01/26/17 [phone]

## 2017-02-02 MED FILL — EPINEPHRINE 0.3 MG AUTO-INJ: 0.3 | 30 days supply | Qty: 2 | Fill #0

## 2017-04-10 MED FILL — AZITHROMYCIN 250 MG TAB: 250 | 5 days supply | Qty: 6 | Fill #0

## 2017-06-04 DIAGNOSIS — Z0000 Encounter for general adult medical examination without abnormal findings: Secondary | ICD-10-CM | POA: Diagnosis not present

## 2017-06-05 DIAGNOSIS — Z125 Encounter for screening for malignant neoplasm of prostate: Secondary | ICD-10-CM | POA: Diagnosis not present

## 2017-06-05 DIAGNOSIS — Z0000 Encounter for general adult medical examination without abnormal findings: Secondary | ICD-10-CM | POA: Diagnosis not present

## 2017-06-08 DIAGNOSIS — Z720 Tobacco use: Secondary | ICD-10-CM | POA: Diagnosis not present

## 2017-06-08 DIAGNOSIS — Z0000 Encounter for general adult medical examination without abnormal findings: Secondary | ICD-10-CM | POA: Diagnosis not present

## 2017-06-08 DIAGNOSIS — Z1389 Encounter for screening for other disorder: Secondary | ICD-10-CM | POA: Diagnosis not present

## 2017-06-08 DIAGNOSIS — R1084 Generalized abdominal pain: Secondary | ICD-10-CM | POA: Diagnosis not present

## 2017-06-08 DIAGNOSIS — K219 Gastro-esophageal reflux disease without esophagitis: Secondary | ICD-10-CM | POA: Diagnosis not present

## 2017-06-08 DIAGNOSIS — R05 Cough: Secondary | ICD-10-CM | POA: Diagnosis not present

## 2017-06-08 DIAGNOSIS — E7849 Other hyperlipidemia: Secondary | ICD-10-CM | POA: Diagnosis not present

## 2017-06-08 DIAGNOSIS — Z6825 Body mass index (BMI) 25.0-25.9, adult: Secondary | ICD-10-CM | POA: Diagnosis not present

## 2017-06-08 DIAGNOSIS — R109 Unspecified abdominal pain: Secondary | ICD-10-CM | POA: Insufficient documentation

## 2017-06-15 DIAGNOSIS — Z1212 Encounter for screening for malignant neoplasm of rectum: Secondary | ICD-10-CM | POA: Diagnosis not present

## 2017-07-27 IMAGING — MR MR LUMBAR SPINE W/O CM
4 of 5 series · 19 of 48 positions shown · non-contrast
Comparison: None.

CLINICAL DATA: Low back pain radiating into the left leg
intermittently for 4 months. Weakness and numbness in the left leg.
Recent worsening of symptoms.

EXAM:
MRI LUMBAR SPINE WITHOUT CONTRAST
TECHNIQUE: Multiplanar, multisequence MR imaging of the lumbar spine was
performed. No intravenous contrast was administered.

[Series 3: T1 · sagittal · 4.0mm · 0.51mm/px · 3 of 12 slices shown (1 of 2)]
[im 3/12]
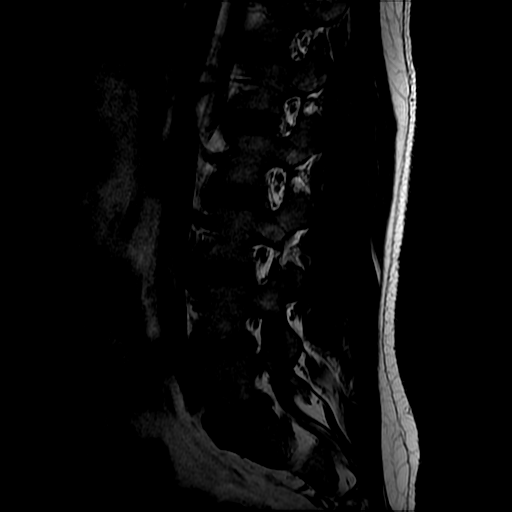
[im 7/12]
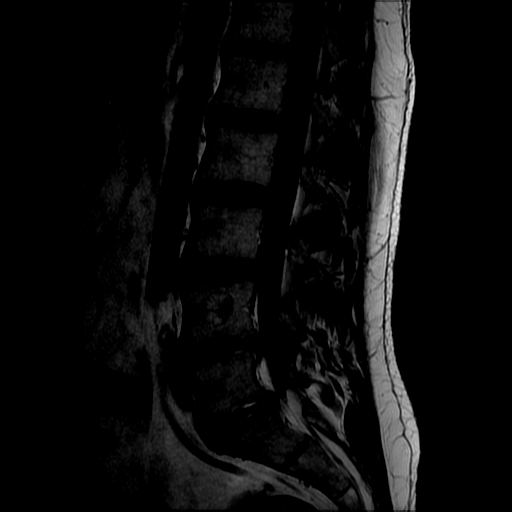
[im 12/12]
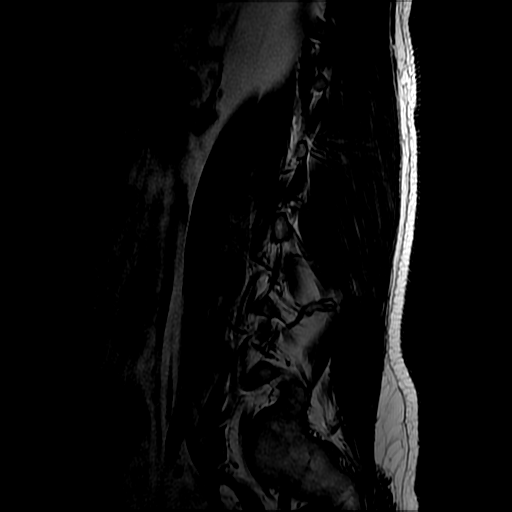

[Series 4: T2 · sagittal · 4.0mm · 0.51mm/px · 5 of 12 slices shown (1 of 2)]
[im 1/12]
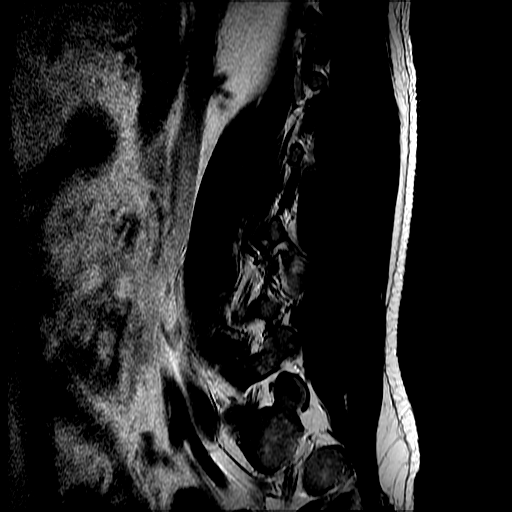
[im 3/12]
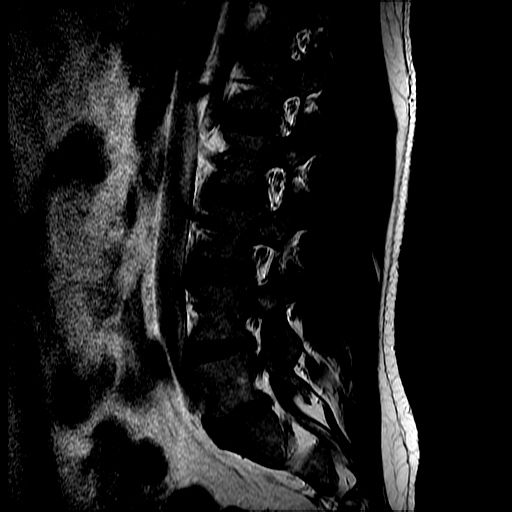
[im 6/12]
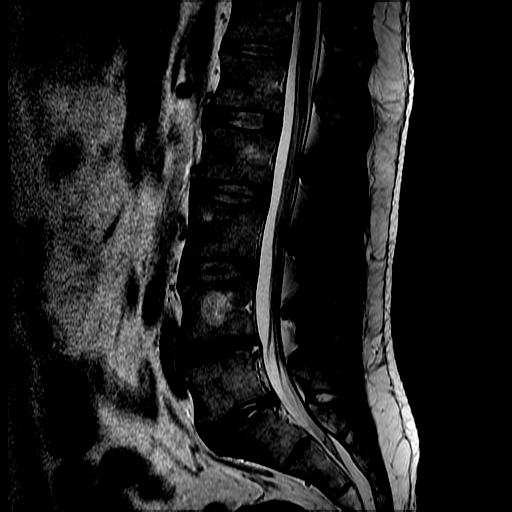
[im 9/12]
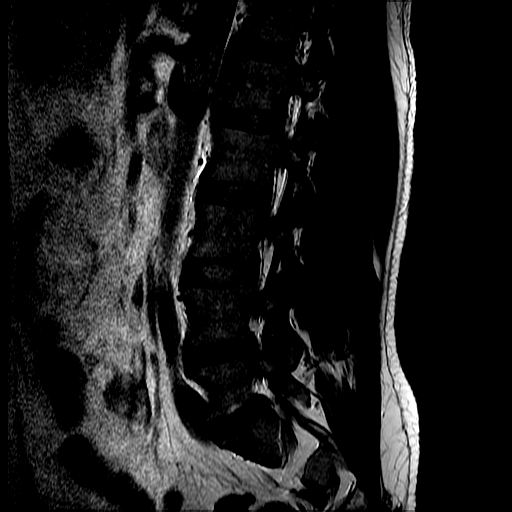
[im 12/12]
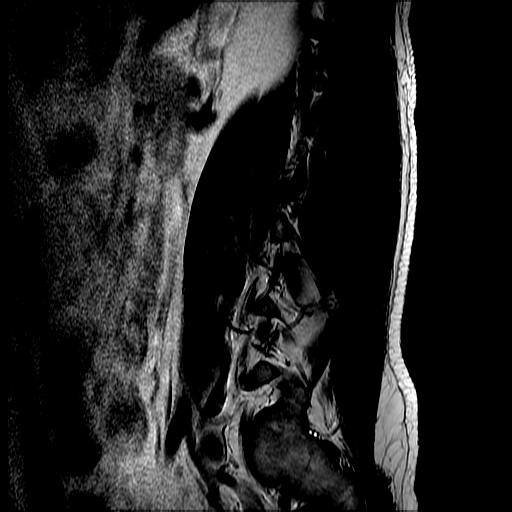

[Series 6: T2 · axial · 4.0mm · 0.39mm/px · z∈[-52,+102]mm · 8 of 35 slices shown (2 of 2)]
[im 3/35]
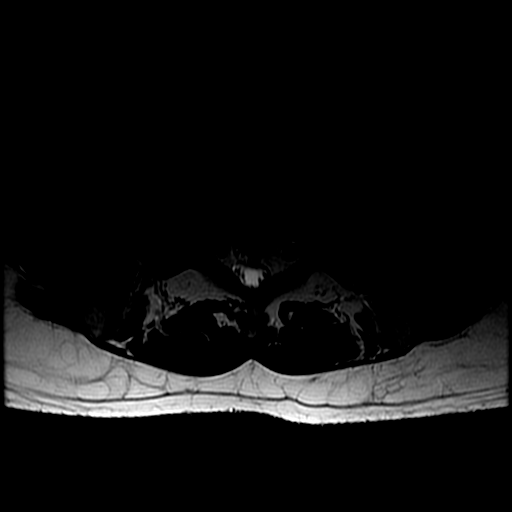
[im 5/35]
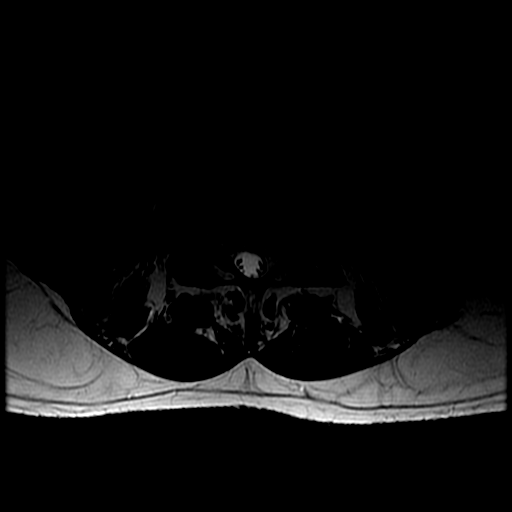
[im 7/35]
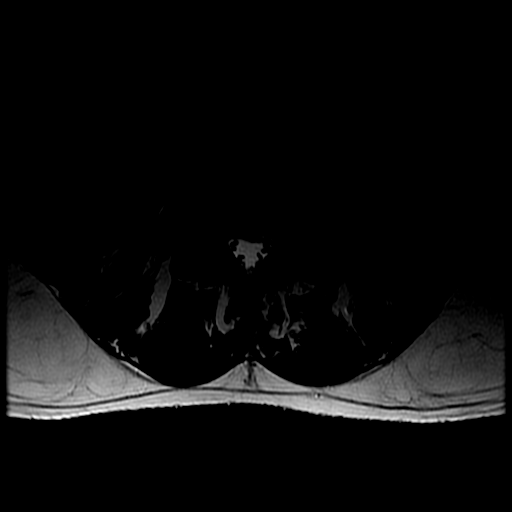
[im 12/35]
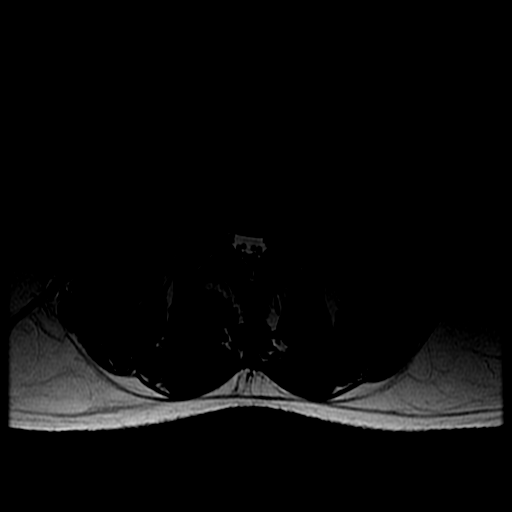
[im 16/35]
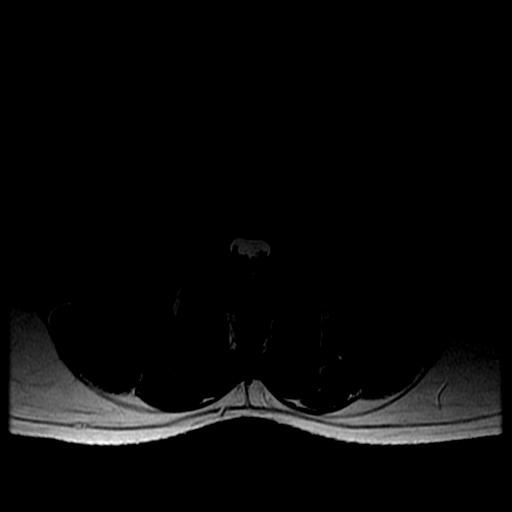
[im 19/35]
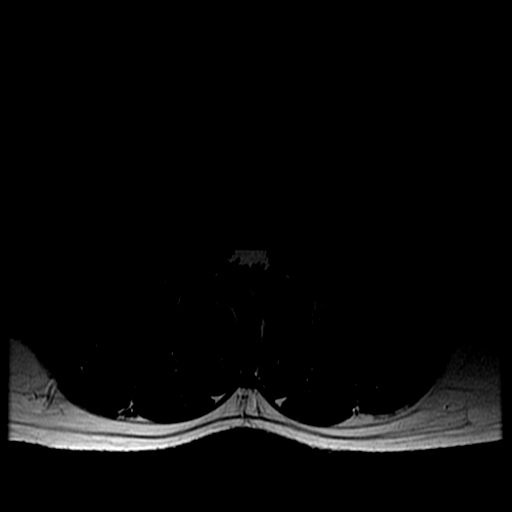
[im 21/35]
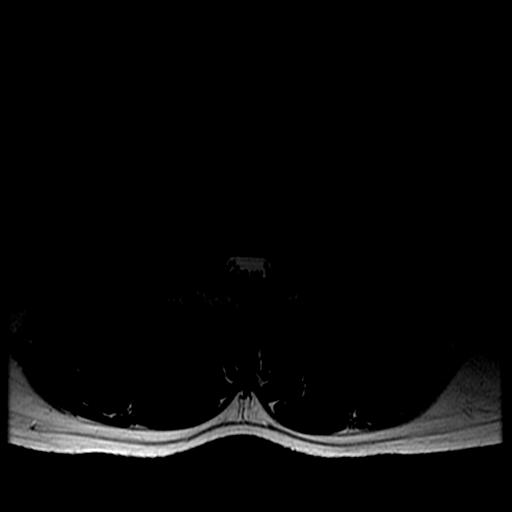
[im 30/35]
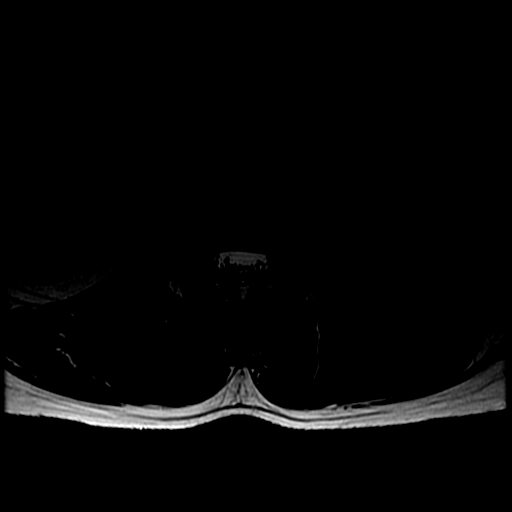

[Series 7: T1 · axial · 4.0mm · 0.39mm/px · z∈[-43,+102]mm · 3 of 35 slices shown (2 of 2)]
[im 5/35]
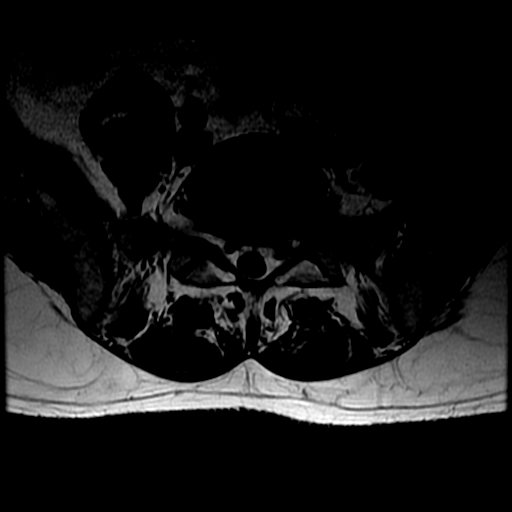
[im 19/35]
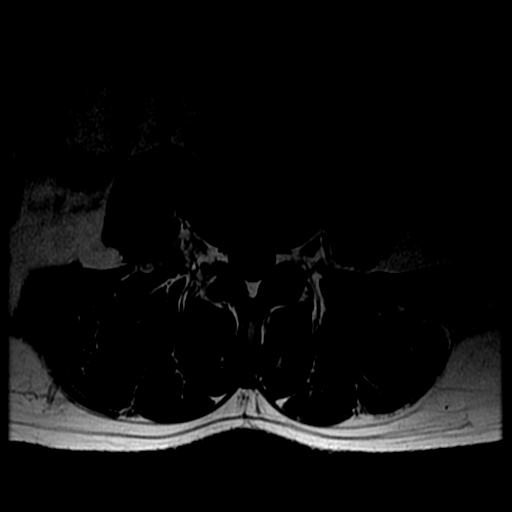
[im 30/35]
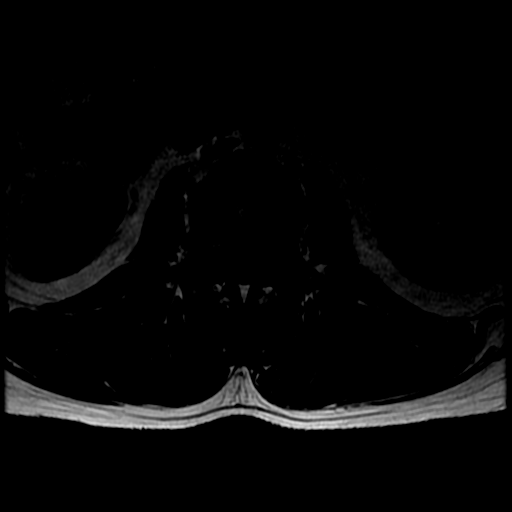

[19 of 48 positions shown; findings below may reference images not displayed]

FINDINGS: Segmentation: Conventional anatomy assumed, with the last open disc
space designated L5-S1.

Alignment:  Normal.

Vertebrae: No worrisome osseous lesion, acute fracture or pars
defect. There is a prominent hemangioma centrally within the L4
vertebral body. The visualized sacroiliac joints appear
unremarkable.

Conus medullaris: Extends to the L1-2 level and appears normal.

Paraspinal and other soft tissues: No significant paraspinal
findings.

Disc levels:

No significant disc space findings from T12-L1 through L2-3.

L3-4: Disc height and hydration are maintained. Mild bilateral facet
hypertrophy. No spinal stenosis or nerve root encroachment.

L4-5: Disc desiccation with loss of height, annular disc bulging and
a shallow disc protrusion in the left subarticular zone. There is
narrowing of the left lateral recess and possible encroachment on
the left L5 nerve root. Both foramina are patent. There is mild
facet and ligamentous hypertrophy.

L5-S1: Disc desiccation with loss of height, annular disc bulging
and small endplate osteophytes. Mild facet and ligamentous
hypertrophy. Mild narrowing of both lateral recesses without nerve
root encroachment. The foramina are patent.
IMPRESSION: 1. Shallow disc protrusion in the left subarticular zone at L[DATE]
encroach on the left L5 nerve root and contribute to the patient's
symptoms.
2. Chronic disc degeneration at L5-S1 without resulting spinal
stenosis or nerve root encroachment.
3. No foraminal compromise or other significant disc space findings.

## 2017-12-13 MED FILL — METHYLPREDNISOLONE 4 MG TAB: 4 | 6 days supply | Qty: 21 | Fill #0

## 2017-12-17 DIAGNOSIS — M5416 Radiculopathy, lumbar region: Secondary | ICD-10-CM | POA: Diagnosis not present

## 2017-12-18 ENCOUNTER — Other Ambulatory Visit (HOSPITAL_COMMUNITY): Payer: Self-pay | Admitting: Student

## 2017-12-18 DIAGNOSIS — M5416 Radiculopathy, lumbar region: Secondary | ICD-10-CM

## 2017-12-21 ENCOUNTER — Ambulatory Visit (HOSPITAL_COMMUNITY)
Admission: RE | Admit: 2017-12-21 | Discharge: 2017-12-21 | Disposition: A | Discharge status: Home / Self Care | Payer: 59 | Source: Ambulatory Visit | Attending: Student | Admitting: Student

## 2017-12-21 DIAGNOSIS — M5126 Other intervertebral disc displacement, lumbar region: Secondary | ICD-10-CM | POA: Insufficient documentation

## 2017-12-21 DIAGNOSIS — M545 Low back pain: Secondary | ICD-10-CM | POA: Diagnosis not present

## 2017-12-21 DIAGNOSIS — M5416 Radiculopathy, lumbar region: Secondary | ICD-10-CM | POA: Insufficient documentation

## 2017-12-21 DIAGNOSIS — M48061 Spinal stenosis, lumbar region without neurogenic claudication: Secondary | ICD-10-CM | POA: Insufficient documentation

## 2018-01-07 DIAGNOSIS — G8929 Other chronic pain: Secondary | ICD-10-CM | POA: Diagnosis not present

## 2018-01-07 DIAGNOSIS — M545 Low back pain: Secondary | ICD-10-CM | POA: Diagnosis not present

## 2018-03-15 DIAGNOSIS — Z0100 Encounter for examination of eyes and vision without abnormal findings: Secondary | ICD-10-CM | POA: Diagnosis not present

## 2018-06-11 DIAGNOSIS — R82998 Other abnormal findings in urine: Secondary | ICD-10-CM | POA: Diagnosis not present

## 2018-06-11 DIAGNOSIS — Z0000 Encounter for general adult medical examination without abnormal findings: Secondary | ICD-10-CM | POA: Diagnosis not present

## 2018-06-11 DIAGNOSIS — Z125 Encounter for screening for malignant neoplasm of prostate: Secondary | ICD-10-CM | POA: Diagnosis not present

## 2018-06-18 DIAGNOSIS — Z6825 Body mass index (BMI) 25.0-25.9, adult: Secondary | ICD-10-CM | POA: Diagnosis not present

## 2018-06-18 DIAGNOSIS — Z1389 Encounter for screening for other disorder: Secondary | ICD-10-CM | POA: Diagnosis not present

## 2018-06-18 DIAGNOSIS — Z720 Tobacco use: Secondary | ICD-10-CM | POA: Diagnosis not present

## 2018-06-18 DIAGNOSIS — F1099 Alcohol use, unspecified with unspecified alcohol-induced disorder: Secondary | ICD-10-CM | POA: Diagnosis not present

## 2018-06-18 DIAGNOSIS — M48061 Spinal stenosis, lumbar region without neurogenic claudication: Secondary | ICD-10-CM | POA: Diagnosis not present

## 2018-06-18 DIAGNOSIS — E7849 Other hyperlipidemia: Secondary | ICD-10-CM | POA: Diagnosis not present

## 2018-06-18 DIAGNOSIS — K219 Gastro-esophageal reflux disease without esophagitis: Secondary | ICD-10-CM | POA: Diagnosis not present

## 2018-06-18 DIAGNOSIS — Z0000 Encounter for general adult medical examination without abnormal findings: Secondary | ICD-10-CM | POA: Diagnosis not present

## 2018-06-18 MED FILL — BUPROPION HCL SR 150 MG TAB: 150 | 90 days supply | Qty: 90 | Fill #0

## 2018-07-11 MED FILL — METHYLPREDNISOLONE 4 MG TBP: 4 | 6 days supply | Qty: 21 | Fill #0

## 2018-07-12 ENCOUNTER — Other Ambulatory Visit: Payer: Self-pay | Admitting: Student

## 2018-07-12 DIAGNOSIS — M545 Low back pain: Principal | ICD-10-CM

## 2018-07-12 DIAGNOSIS — G8929 Other chronic pain: Secondary | ICD-10-CM

## 2018-07-19 ENCOUNTER — Inpatient Hospital Stay
Admission: RE | Admit: 2018-07-19 | Discharge: 2018-07-19 | Disposition: A | Discharge status: Home / Self Care | Payer: 59 | Source: Ambulatory Visit | Attending: Student | Admitting: Student

## 2018-07-19 ENCOUNTER — Ambulatory Visit
Admission: RE | Admit: 2018-07-19 | Discharge: 2018-07-19 | Disposition: A | Discharge status: Home / Self Care | Payer: 59 | Source: Ambulatory Visit | Attending: Student | Admitting: Student

## 2018-07-19 DIAGNOSIS — M5126 Other intervertebral disc displacement, lumbar region: Secondary | ICD-10-CM | POA: Diagnosis not present

## 2018-07-19 DIAGNOSIS — M545 Low back pain, unspecified: Secondary | ICD-10-CM

## 2018-07-19 DIAGNOSIS — G8929 Other chronic pain: Secondary | ICD-10-CM

## 2018-07-19 MED ORDER — IOPAMIDOL (ISOVUE-M 200) INJECTION 41%
1.0000 mL | Freq: Once | INTRAMUSCULAR | Status: AC
  Administered 2018-07-19: 1 mL via EPIDURAL

## 2018-07-19 MED ORDER — METHYLPREDNISOLONE ACETATE 40 MG/ML INJ SUSP (RADIOLOG
120.0000 mg | Freq: Once | INTRAMUSCULAR | Status: AC
  Administered 2018-07-19: 120 mg via EPIDURAL

## 2018-07-19 MED ORDER — METHYLPREDNISOLONE ACETATE 40 MG/ML INJ SUSP (RADIOLOG: 120.0000 mg | Freq: Once | INTRAMUSCULAR | Status: DC

## 2018-07-19 MED ORDER — IOPAMIDOL (ISOVUE-M 200) INJECTION 41%: 1.0000 mL | Freq: Once | INTRAMUSCULAR | Status: DC

## 2018-07-19 NOTE — Discharge Instructions (Signed)

## 2018-07-22 ENCOUNTER — Other Ambulatory Visit: Payer: Self-pay | Admitting: Neurological Surgery

## 2018-07-22 DIAGNOSIS — M5416 Radiculopathy, lumbar region: Secondary | ICD-10-CM

## 2018-08-08 ENCOUNTER — Encounter: Payer: Self-pay | Admitting: Physical Therapy

## 2018-08-08 ENCOUNTER — Ambulatory Visit: Payer: 59 | Attending: Neurological Surgery | Admitting: Physical Therapy

## 2018-08-08 ENCOUNTER — Other Ambulatory Visit: Payer: Self-pay

## 2018-08-08 DIAGNOSIS — M5442 Lumbago with sciatica, left side: Secondary | ICD-10-CM | POA: Diagnosis not present

## 2018-08-08 DIAGNOSIS — M6281 Muscle weakness (generalized): Secondary | ICD-10-CM | POA: Insufficient documentation

## 2018-08-08 NOTE — Therapy (Signed)
[facility] [address] [place], [place], [address] Phone: [phone]   Fax:  [phone]  Physical Therapy Evaluation  Patient Details  Name: Christopher Osborne MRN: [id number] Date of Birth: 01-16-1971 Referring Provider (PT): Dr. Sherley Bounds   Encounter Date: 08/08/2018  PT End of Session - 08/08/18 1700    Visit Number  1    Date for PT Re-Evaluation  10/03/18    Authorization Type  UMR no visit limit    PT Start Time  0801    PT Stop Time  0850    PT Time Calculation (min)  49 min    Activity Tolerance  Patient tolerated treatment well;Patient limited by pain       Past Medical History:  Diagnosis Date  . Arm fracture, left 1994  . GERD (gastroesophageal reflux disease)     Past Surgical History:  Procedure Laterality Date  . ESOPHAGOGASTRODUODENOSCOPY N/A 12/18/2013   Procedure: ESOPHAGOGASTRODUODENOSCOPY (EGD);  Surgeon: Milus Banister, MD;  Location: [facility];  Service: Endoscopy;  Laterality: N/A;    There were no vitals filed for this visit.   Subjective Assessment - 08/08/18 0805    Subjective  LBP last year had a shot which helped 1 year.  Then flared up December.  Worse last 2 weeks.  Left low back and left LE posterior lateral to toes.  Numbness/tingling lateral toes.  Constant.  Worse with sitting.  Standing/walking helps.  Steroid shot and steroid meds 3 weeks ago no help.  Doing sitting piriformis stretch.      Pertinent History  Used to be professional golfer    Limitations  Sitting;Lifting    Diagnostic tests  MRI  June 2019    Patient Stated Goals  relieve some of this pain; be able to function    Currently in Pain?  Yes    Pain Score  7     Pain Location  Back    Pain Frequency  Constant    Aggravating Factors   sitting,  most positions,  hard to put on pants, shoes, AM ;cough sneeze ; not sleeping well     Pain Relieving Factors  walking, standing, heat, ice         [facility] PT  Assessment - 08/08/18 0001      Assessment   Medical Diagnosis  lumbar radiculopathy    Referring Provider (PT)  Dr. Sherley Bounds    Onset Date/Surgical Date  --   December   Next MD Visit  as needed    Prior Therapy  chiro 1 year ago      Precautions   Precautions  None      Restrictions   Weight Bearing Restrictions  No      Balance Screen   Has the patient fallen in the past 6 months  No    Has the patient had a decrease in activity level because of a fear of falling?   No    Is the patient reluctant to leave their home because of a fear of falling?   No      Home Environment   Living Environment  Private residence    Living Arrangements  Spouse/significant other;Children    Type of Ava      Prior Function   Level of Independence  Independent    Vocation  Full time employment    Vocation Requirements  sitting     Leisure  play with kids  Observation/Other Assessments   Focus on Therapeutic Outcomes (FOTO)   69% limitation      AROM   AROM Assessment Site  --   Gower's sign with flexion;  extension preference    Lumbar Flexion  60    Lumbar Extension  20    Lumbar - Right Side Bend  30    Lumbar - Left Side Bend  25      Strength   Strength Assessment Site  --   Able to heel and toe walk    Right Hip Flexion  5/5    Right Hip ABduction  5/5    Left Hip ABduction  4-/5    Lumbar Flexion  4-/5    Lumbar Extension  4-/5      Palpation   Palpation comment  minimal tender points in left paraspinals       Slump test   Findings  Positive    Side  --   bilaterally > left     Prone Knee Bend Test   Findings  Negative      Ambulation/Gait   Gait Comments  slow, antalgic gait                 Objective measurements completed on examination: See above findings.      [facility] Adult PT Treatment/Exercise - 08/08/18 0001      Moist Heat Therapy   Number Minutes Moist Heat  11 Minutes    Moist Heat Location  Lumbar Spine      Electrical  Stimulation   Electrical Stimulation Location  lumbar    Electrical Stimulation Action  IFC    Electrical Stimulation Parameters  10 ma 11 min supine with LES on large wedge    Electrical Stimulation Goals  Pain             PT Education - 08/08/18 0849    Education Details   Access Code: [email or web address] home TENS info;  trial of extensions in lying and standing every 2 hours; use of lumbar roll; limit sitting    Person(s) Educated  Patient    Methods  Explanation;Demonstration;Handout    Comprehension  Returned demonstration;Verbalized understanding       PT Short Term Goals - 08/08/18 1711      PT SHORT TERM GOAL #1   Title  The patient will demonstrate knowledge of basic self care: use of lumbar roll, flexion avoidance, initial HEP    Time  4    Period  Weeks    Status  New    Target Date  09/05/18      PT SHORT TERM GOAL #2   Title  The patient will report a 40% improvement of lumbar and left LE symptoms with home and work ADLs    Time  4    Period  Weeks    Status  New      PT SHORT TERM GOAL #3   Title  The patient will have improved lumbar flexion to 65 and bil sidebending ROM to 30 degrees bil needed for housework and taking care of his kids    Time  4    Period  Weeks    Status  New        PT Long Term Goals - 08/08/18 1715      PT LONG TERM GOAL #1   Title  The patient will be independent in safe self progression of HEP     Time  8  Period  Weeks    Status  New    Target Date  10/03/18      PT LONG TERM GOAL #2   Title  The patient will report a 70% improvement in lumbar and left LE symptoms with usual ADLs    Time  8    Period  Weeks    Status  New      PT LONG TERM GOAL #3   Title  Lumbo/pelvic/hip and left LE strength grossly 4+/5 needed for home and work ADLs    Time  8    Period  Weeks    Status  New      PT LONG TERM GOAL #4   Title  FOTO functional outcome score improved from 69% limitation to 42% indicating improved function with less  pain     Time  8    Period  Weeks    Status  New             Plan - 08/08/18 2637    Clinical Impression Statement  LBP last year had a shot which helped 1 year.  Then flared up December.  Worse last 2 weeks.  Left low back and left LE posterior thigh and leg pain and constant numbness in his left foot.  Symptoms are worsened with sitting and better with walking.  He had a recent round of oral steroids and an injection which did not help symptoms at all.  He complains of difficulty sleeping (did not sleep well last night) and he was unable to go to work the last 2 days.  He is limited in taking care of his 2 young children and help out around the house.  + pain with cough/sneeze.  Negative bowel/bladder changes.  Decreased lumbar lordosis noted.   Slow antalgic gait.   Limited lumbar flexion and sidebending ROM.  Repeated movement testing indicates a possible preference for extension with centralization noted.    + Slump test bilaterally with left side worse.  Decreased lumbar/pelvic/hip core strength.  Extensive MMT not performed secondary to high pain intensity.  Following session including extension biased ex, education and ES/heat in supine with LEs on wedge, he is ambulating with improved gait speed and agility.      History and Personal Factors relevant to plan of care:  no co-morbidities;  good psychosocial support    Clinical Presentation  Stable    Clinical Decision Making  Low    Rehab Potential  Good    Clinical Impairments Affecting Rehab Potential  none    PT Frequency  2x / week    PT Duration  8 weeks    PT Treatment/Interventions  ADLs/Self Care Home Management;Cryotherapy;Electrical Stimulation;Ultrasound;Traction;Moist Heat;Therapeutic exercise;Therapeutic activities;Patient/family education;Neuromuscular re-education;Manual techniques;Dry needling;Taping    PT Next Visit Plan  assess response to prone press ups, standing extensions and progress per McKenzie for  centralization;   may be a candidate for lumbar traction;  ES/heat;      PT Home Exercise Plan   Access Code: [email or web address]     Consulted and Agree with Plan of Care  Patient       Patient will benefit from skilled therapeutic intervention in order to improve the following deficits and impairments:  Pain, Postural dysfunction, Decreased activity tolerance, Decreased range of motion, Decreased strength  Visit Diagnosis: Acute left-sided low back pain with left-sided sciatica - Plan: PT plan of care cert/re-cert  Muscle weakness (generalized) - Plan: PT plan of care cert/re-cert  Problem List Patient Active Problem List   Diagnosis Date Noted  . FOOT PAIN, RIGHT 07/15/2008   Ruben Im, PT 08/08/18 5:21 PM Phone: [phone] Fax: [phone] Alvera Singh 08/08/2018, 5:20 PM  [facility] [address] [place], [place], [address] Phone: [phone]   Fax:  [phone]  Name: Christopher Osborne MRN: [id number] Date of Birth: 21-Jul-1970

## 2018-08-08 NOTE — Patient Instructions (Signed)
       TENS UNIT  This is helpful for muscle pain and spasm.   Search and Purchase a TENS 7000 2nd edition at www.tenspros.com or www.amazon.com  (It should be less than $30)     TENS unit instructions:   Do not shower or bathe with the unit on  Turn the unit off before removing electrodes or batteries  If the electrodes lose stickiness add a drop of water to the electrodes after they are disconnected from the unit and place on plastic sheet. If you continued to have difficulty, call the TENS unit company to purchase more electrodes.  Do not apply lotion on the skin area prior to use. Make sure the skin is clean and dry as this will help prolong the life of the electrodes.  After use, always check skin for unusual red areas, rash or other skin difficulties. If there are any skin problems, does not apply electrodes to the same area.  Never remove the electrodes from the unit by pulling the wires.  Do not use the TENS unit or electrodes other than as directed.  Do not change electrode placement without consulting your therapist or physician.  Keep 2 fingers with between each electrode.    Access Code: [email or web address]  URL: [email or web address]  Date: 08/08/2018  Prepared by: Ruben Im   Exercises  Prone Press Up - 10 reps - 1 sets - 8x daily - 7x weekly  Standing Lumbar Extension - 10 reps - 1 sets - 8x daily - 7x weekly

## 2018-08-09 MED FILL — METHYLPREDNISOLONE 4 MG TBP: 4 | 6 days supply | Qty: 21 | Fill #0

## 2018-08-13 MED FILL — GABAPENTIN 300 MG CAPSULE: 300 | 30 days supply | Qty: 30 | Fill #0

## 2018-08-14 ENCOUNTER — Ambulatory Visit: Payer: 59

## 2018-08-14 DIAGNOSIS — M5442 Lumbago with sciatica, left side: Secondary | ICD-10-CM | POA: Diagnosis not present

## 2018-08-14 DIAGNOSIS — M6281 Muscle weakness (generalized): Secondary | ICD-10-CM

## 2018-08-14 NOTE — Patient Instructions (Signed)
Access Code: [email or web address]  URL: [email or web address]  Date: 08/14/2018  Prepared by: Sigurd Sos   Exercises  Clamshell - 10 reps - 2 sets - 2x daily - 7x weekly Prone Hip Extension - 10 reps - 1 sets - 2x daily - 7x weekly Supine Bridge - 10 reps - 2 sets - 5 hold - 1x daily - 7x weekly

## 2018-08-14 NOTE — Therapy (Addendum)
[facility] [address] [place], [place], [address] Phone: [phone]   Fax:  [phone]  Physical Therapy Treatment  Patient Details  Name: Christopher Osborne MRN: [id number] Date of Birth: Apr 18, 1971 Referring Provider (PT): Dr. Sherley Bounds   Encounter Date: 08/14/2018  PT End of Session - 08/14/18 0829    Visit Number  2    Date for PT Re-Evaluation  10/03/18    Authorization Type  UMR no visit limit    PT Start Time  0800    PT Stop Time  0845    PT Time Calculation (min)  45 min    Activity Tolerance  Patient tolerated treatment well    Behavior During Therapy  [facility] for tasks assessed/performed       Past Medical History:  Diagnosis Date  . Arm fracture, left 1994  . GERD (gastroesophageal reflux disease)     Past Surgical History:  Procedure Laterality Date  . ESOPHAGOGASTRODUODENOSCOPY N/A 12/18/2013   Procedure: ESOPHAGOGASTRODUODENOSCOPY (EGD);  Surgeon: Milus Banister, MD;  Location: [facility];  Service: Endoscopy;  Laterality: N/A;    There were no vitals filed for this visit.  Subjective Assessment - 08/14/18 0757    Subjective  I am a little better.  I am getting better sleep.      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  Yes    Pain Score  6     Pain Location  Back    Pain Orientation  Left    Pain Descriptors / Indicators  Aching;Sore;Shooting;Burning    Pain Type  Chronic pain    Pain Onset  More than a month ago    Pain Frequency  Constant    Aggravating Factors   sitting, morning hours, sleep at night    Pain Relieving Factors  walking, standing, heat/ice                       [facility] Adult PT Treatment/Exercise - 08/14/18 0001      Exercises   Exercises  Knee/Hip;Lumbar      Lumbar Exercises: Standing   Other Standing  Lumbar Exercises  extension 2x10      Lumbar Exercises: Prone   Other Prone Lumbar Exercises  prone press up 2x10    Other Prone Lumbar Exercises  prone on elbows x 3 minutes      Knee/Hip Exercises: Supine   Bridges  Strengthening;2 sets;10 reps      Knee/Hip Exercises: Sidelying   Clams  2x10 with abdominal bracing       Knee/Hip Exercises: Prone   Hip Extension  Strengthening;Both;2 sets;10 reps      Modalities   Modalities  Traction      Traction   Type of Traction  Lumbar    Min (lbs)  50    Max (lbs)  85    Hold Time  60    Rest Time  20    Time  15             PT Education - 08/14/18 0837    Education Details  Access Code: [email or web address]    Person(s) Educated  Patient    Methods  Explanation;Demonstration;Handout    Comprehension  Verbalized understanding;Returned demonstration  PT Short Term Goals - 08/08/18 1711      PT SHORT TERM GOAL #1   Title  The patient will demonstrate knowledge of basic self care: use of lumbar roll, flexion avoidance, initial HEP    Time  4    Period  Weeks    Status  New    Target Date  09/05/18      PT SHORT TERM GOAL #2   Title  The patient will report a 40% improvement of lumbar and left LE symptoms with home and work ADLs    Time  4    Period  Weeks    Status  New      PT SHORT TERM GOAL #3   Title  The patient will have improved lumbar flexion to 65 and bil sidebending ROM to 30 degrees bil needed for housework and taking care of his kids    Time  4    Period  Weeks    Status  New        PT Long Term Goals - 08/08/18 1715      PT LONG TERM GOAL #1   Title  The patient will be independent in safe self progression of HEP     Time  8    Period  Weeks    Status  New    Target Date  10/03/18      PT LONG TERM GOAL #2   Title  The patient will report a 70% improvement in lumbar and left LE symptoms with usual ADLs    Time  8    Period  Weeks    Status  New      PT LONG TERM GOAL #3   Title   Lumbo/pelvic/hip and left LE strength grossly 4+/5 needed for home and work ADLs    Time  8    Period  Weeks    Status  New      PT LONG TERM GOAL #4   Title  FOTO functional outcome score improved from 69% limitation to 42% indicating improved function with less pain     Time  8    Period  Weeks    Status  New            Plan - 08/14/18 1601    Clinical Impression Statement  Pt with first time follow-up after evaluation.  Pt reports some improvement in Lt LE symptoms with extension based exercise.  Pt demonstrated good technique with initial HEP and PT added additional extension based exercise.  Pt tolerated traction well today.  Pt will continue to benefit from skilled PT for traction, manual and exercise to reduce Lt LE radiculopathy.      Rehab Potential  Good    PT Frequency  2x / week    PT Duration  8 weeks    PT Treatment/Interventions  ADLs/Self Care Home Management;Cryotherapy;Electrical Stimulation;Ultrasound;Traction;Moist Heat;Therapeutic exercise;Therapeutic activities;Patient/family education;Neuromuscular re-education;Manual techniques;Dry needling;Taping    PT Next Visit Plan  see how pt responded to traction and continue if helpful, extension based exercise, dry needling to Lt gluteals    PT Home Exercise Plan   Access Code: [email or web address]     Consulted and Agree with Plan of Care  Patient       Patient will benefit from skilled therapeutic intervention in order to improve the following deficits and impairments:  Pain, Postural dysfunction, Decreased activity tolerance, Decreased range of motion, Decreased strength  Visit Diagnosis: Acute left-sided low back pain with  left-sided sciatica  Muscle weakness (generalized)     Problem List Patient Active Problem List   Diagnosis Date Noted  . FOOT PAIN, RIGHT 07/15/2008    Sigurd Sos, PT 08/14/18 8:37 AM   [facility] [address] [place], [place], [address] Phone: [phone]   Fax:  [phone]  Name: Christopher Osborne MRN: [id number] Date of Birth: Jan 10, 1971

## 2018-08-16 ENCOUNTER — Encounter: Payer: Self-pay | Admitting: Physical Therapy

## 2018-08-16 ENCOUNTER — Ambulatory Visit: Payer: 59 | Admitting: Physical Therapy

## 2018-08-16 DIAGNOSIS — M6281 Muscle weakness (generalized): Secondary | ICD-10-CM

## 2018-08-16 DIAGNOSIS — M5442 Lumbago with sciatica, left side: Secondary | ICD-10-CM

## 2018-08-16 NOTE — Therapy (Signed)
[facility] [address], [place], [place], [address] Phone: [phone]   Fax:  [phone]  Physical Therapy Treatment  Patient Details  Name: Christopher Osborne MRN: [id number] Date of Birth: [birth year] Referring Provider (PT): Dr. Sherley Bounds   Encounter Date: 08/16/2018  PT End of Session - 08/16/18 0803    Visit Number  3    Date for PT Re-Evaluation  10/03/18    Authorization Type  UMR no visit limit    PT Start Time  0752    PT Stop Time  0832    PT Time Calculation (min)  40 min    Activity Tolerance  Patient tolerated treatment well;No increased pain    Behavior During Therapy  WFL for tasks assessed/performed       Past Medical History:  Diagnosis Date  . Arm fracture, left 1994  . GERD (gastroesophageal reflux disease)     Past Surgical History:  Procedure Laterality Date  . ESOPHAGOGASTRODUODENOSCOPY N/A 12/18/2013   Procedure: ESOPHAGOGASTRODUODENOSCOPY (EGD);  Surgeon: Milus Banister, MD;  Location: [facility];  Service: Endoscopy;  Laterality: N/A;    There were no vitals filed for this visit.  Subjective Assessment - 08/16/18 0756    Subjective  Pt reports that things are going well. He feels atleast 20% improved.     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  Yes    Pain Score  7     Pain Onset  More than a month ago                       [facility] Adult PT Treatment/Exercise - 08/16/18 0001      Self-Care   Self-Care  Posture    Posture  posture throughout the day to maintain extension (seated with lumbar roll, log roll, etc)       Lumbar Exercises: Standing   Other Standing Lumbar Exercises  repeated extension standing x10 reps (improvements in tingling and pain noted), x10 more reps       Lumbar Exercises: Prone   Opposite Arm/Leg  Raise  10 reps;Left arm/Right leg;Right arm/Left leg    Other Prone Lumbar Exercises  prone pressups with elbows extended x10 reps, additional 20 reps with therapist overpressure       Knee/Hip Exercises: Sidelying   Clams  x15 reps with red TB each LE       Manual Therapy   Manual Therapy  Joint mobilization    Joint Mobilization  CPAs Grade III-IV L5-S2 x2 bouts; overpressure maintained at L5 during prone press up activity              PT Education - 08/16/18 0834    Education Details  posture; updated and reviewed HEP    Person(s) Educated  Patient    Methods  Explanation;Verbal cues;Handout    Comprehension  Returned demonstration;Verbalized understanding       PT Short Term Goals - 08/16/18 2774      PT SHORT TERM GOAL #1   Title  The patient will demonstrate knowledge of basic self care: use of lumbar roll, flexion avoidance, initial HEP    Baseline  reviewed again this session    Time  4    Period  Weeks  Status  Partially Met    Target Date  09/05/18      PT SHORT TERM GOAL #2   Title  The patient will report a 40% improvement of lumbar and left LE symptoms with home and work ADLs    Baseline  20%    Time  4    Period  Weeks    Status  Partially Met      PT SHORT TERM GOAL #3   Title  The patient will have improved lumbar flexion to 65 and bil sidebending ROM to 30 degrees bil needed for housework and taking care of his kids    Time  4    Period  Weeks    Status  New        PT Long Term Goals - 08/08/18 1715      PT LONG TERM GOAL #1   Title  The patient will be independent in safe self progression of HEP     Time  8    Period  Weeks    Status  New    Target Date  10/03/18      PT LONG TERM GOAL #2   Title  The patient will report a 70% improvement in lumbar and left LE symptoms with usual ADLs    Time  8    Period  Weeks    Status  New      PT LONG TERM GOAL #3   Title  Lumbo/pelvic/hip and left LE strength grossly 4+/5 needed for home  and work ADLs    Time  8    Period  Weeks    Status  New      PT LONG TERM GOAL #4   Title  FOTO functional outcome score improved from 69% limitation to 42% indicating improved function with less pain     Time  8    Period  Weeks    Status  New            Plan - 08/16/18 3419    Clinical Impression Statement  Pt is making progress towards his goals, noting atleast 20% improvement in his symptoms since his evaluation. Upon arrival, he noted pain at 7/10. Pt completed standing extension with centralization noted after 15-20 reps, and then was transitioned to prone extension with therapist overpressure. Pt did require education on the importance of maintained extension posture throughout the day to get optimal results from the extension based program. End of session, pt reported decrease in his pain to 2/10. Will continue with extension based exercise and manual treatment to promote pain free return to activity.     Rehab Potential  Good    PT Frequency  2x / week    PT Duration  8 weeks    PT Treatment/Interventions  ADLs/Self Care Home Management;Cryotherapy;Electrical Stimulation;Ultrasound;Traction;Moist Heat;Therapeutic exercise;Therapeutic activities;Patient/family education;Neuromuscular re-education;Manual techniques;Dry needling;Taping    PT Next Visit Plan  extension based exercise: press ups with overpressure, quad trunk strength;, dry needling to Lt gluteals as needed    PT Home Exercise Plan   Access Code: [email or web address]     Consulted and Agree with Plan of Care  Patient       Patient will benefit from skilled therapeutic intervention in order to improve the following deficits and impairments:  Pain, Postural dysfunction, Decreased activity tolerance, Decreased range of motion, Decreased strength  Visit Diagnosis: Acute left-sided low back pain with left-sided sciatica  Muscle weakness (generalized)     Problem List Patient  Active Problem List   Diagnosis Date Noted  .  FOOT PAIN, RIGHT 07/15/2008    9:23 AM,08/16/18 Sherol Dade PT, DPT [facility] [address], [place], [place], [address] Phone: [phone]   Fax:  [phone]  Name: Christopher Osborne MRN: [id number] Date of Birth: 1970-07-27

## 2018-08-16 NOTE — Patient Instructions (Signed)
Access Code: [email or web address]  URL: [email or web address]  Date: 08/16/2018  Prepared by: Sherol Dade   Exercises  Standing Lumbar Extension - 20 reps - 8x daily - 7x weekly  Clamshell - 10 reps - 2 sets - 1x daily - 7x weekly  Prone Alternating Arm and Leg Lifts - 10 reps - 1 sets - 2x daily - 7x weekly  Supine Bridge - 10 reps - 2 sets - 5 hold - 1x daily - 7x weekly     [facility] [address], [place], [place] [address] Phone # [phone] Fax [phone]

## 2018-08-20 ENCOUNTER — Encounter: Payer: Self-pay | Admitting: Physical Therapy

## 2018-08-20 ENCOUNTER — Ambulatory Visit: Payer: 59 | Admitting: Physical Therapy

## 2018-08-20 DIAGNOSIS — M6281 Muscle weakness (generalized): Secondary | ICD-10-CM | POA: Diagnosis not present

## 2018-08-20 DIAGNOSIS — M5442 Lumbago with sciatica, left side: Secondary | ICD-10-CM

## 2018-08-20 NOTE — Therapy (Signed)
[facility] [address], [place], [place], [address] Phone: [phone]   Fax:  [phone]  Physical Therapy Treatment  Patient Details  Name: Christopher Osborne MRN: [id number] Date of Birth: July 23, 1970 Referring Provider (PT): Dr. Sherley Bounds   Encounter Date: 08/20/2018  PT End of Session - 08/20/18 1833    Visit Number  4    Date for PT Re-Evaluation  10/03/18    Authorization Type  UMR no visit limit    PT Start Time  0800    PT Stop Time  0850    PT Time Calculation (min)  50 min    Activity Tolerance  Patient tolerated treatment well       Past Medical History:  Diagnosis Date  . Arm fracture, left 1994  . GERD (gastroesophageal reflux disease)     Past Surgical History:  Procedure Laterality Date  . ESOPHAGOGASTRODUODENOSCOPY N/A 12/18/2013   Procedure: ESOPHAGOGASTRODUODENOSCOPY (EGD);  Surgeon: Milus Banister, MD;  Location: [facility];  Service: Endoscopy;  Laterality: N/A;    There were no vitals filed for this visit.  Subjective Assessment - 08/20/18 0757    Subjective  It's a little bit better.  It mainly hurts when I sit for a while.  Sleeping better and not the severe pain.  Still with numbness and tingling in the foot.   I should be getting a stand-up desk for work today.      Diagnostic tests  MRI  June 2019    Currently in Pain?  Yes    Pain Score  6     Pain Location  Back    Pain Orientation  Left    Pain Radiating Towards  numbness in foot /leg                        [facility] Adult PT Treatment/Exercise - 08/20/18 0001      Lumbar Exercises: Standing   Other Standing Lumbar Exercises  right wall side glide 20x       Lumbar Exercises: Supine   Ab Set  10 reps    Clam  10 reps    Clam Limitations  red band     Other Supine Lumbar Exercises  supine neural flossing 10x right/left     Other Supine Lumbar Exercises  supine bent knee ankle pumps 10x right/left       Lumbar Exercises: Prone   Other Prone Lumbar Exercises  press up with belt overpressure and exhalation 2x 10       Lumbar Exercises: Quadruped   [place]/Old Horse  10 reps    Other Quadruped Lumbar Exercises  press up into childs pose 5x       Traction   Type of Traction  Lumbar    Min (lbs)  60    Max (lbs)  95    Hold Time  60    Rest Time  20    Time  15             PT Education - 08/20/18 1837    Education Details  supine neural floss;  lateral wall glides;  abdominal brace supine low level     Person(s) Educated  Patient    Methods  Explanation;Demonstration;Handout    Comprehension  Returned demonstration;Verbalized understanding       PT Short Term Goals - 08/16/18 0921      PT SHORT TERM GOAL #1   Title  The patient will demonstrate knowledge of basic self care: use of lumbar roll, flexion avoidance, initial HEP    Baseline  reviewed again this session    Time  4    Period  Weeks    Status  Partially Met    Target Date  09/05/18      PT SHORT TERM GOAL #2   Title  The patient will report a 40% improvement of lumbar and left LE symptoms with home and work ADLs    Baseline  20%    Time  4    Period  Weeks    Status  Partially Met      PT SHORT TERM GOAL #3   Title  The patient will have improved lumbar flexion to 65 and bil sidebending ROM to 30 degrees bil needed for housework and taking care of his kids    Time  4    Period  Weeks    Status  New        PT Long Term Goals - 08/08/18 1715      PT LONG TERM GOAL #1   Title  The patient will be independent in safe self progression of HEP     Time  8    Period  Weeks    Status  New    Target Date  10/03/18      PT LONG TERM GOAL #2   Title  The patient will report a 70% improvement in lumbar and left LE symptoms with usual ADLs    Time  8    Period  Weeks    Status  New      PT LONG TERM GOAL #3   Title  Lumbo/pelvic/hip and left LE strength grossly 4+/5 needed for home and work ADLs    Time   8    Period  Weeks    Status  New      PT LONG TERM GOAL #4   Title  FOTO functional outcome score improved from 69% limitation to 42% indicating improved function with less pain     Time  8    Period  Weeks    Status  New            Plan - 08/20/18 2440    Clinical Impression Statement  The patient reports decreased intensity of pain but peripheral symptoms persist.  Added overpressure to extension ex's and initiated standing lateral component ex's.  Also initiated neural flossing in supine and continued lumbar mobility ex's.  The patient reports decreased intensity of pain with exercise and demonstrates improved agility and walking speed.  Therapist closely monitoring response with all treatment interventions.      Rehab Potential  Good    Clinical Impairments Affecting Rehab Potential  none    PT Frequency  2x / week    PT Duration  8 weeks    PT Treatment/Interventions  ADLs/Self Care Home Management;Cryotherapy;Electrical Stimulation;Ultrasound;Traction;Moist Heat;Therapeutic exercise;Therapeutic activities;Patient/family education;Neuromuscular re-education;Manual techniques;Dry needling;Taping    PT Next Visit Plan  assess response to traction;  extension based exercise with overpressure;  lateral techniques as needed;   quad trunk strength;, dry needling to Lt gluteals as needed    PT Home Exercise Plan   Access Code: [email or web address]        Patient will benefit from skilled therapeutic intervention in order to improve the following deficits and impairments:  Pain, Postural dysfunction, Decreased activity tolerance, Decreased range of motion, Decreased strength  Visit Diagnosis: Acute left-sided  low back pain with left-sided sciatica  Muscle weakness (generalized)     Problem List Patient Active Problem List   Diagnosis Date Noted  . FOOT PAIN, RIGHT 07/15/2008   Ruben Im, PT 08/20/18 6:44 PM Phone: [phone] Fax: [phone]   Alvera Singh 08/20/2018,  6:41 PM  [facility] [address] [place], [place], [address] Phone: [phone]   Fax:  [phone]  Name: Christopher Osborne MRN: [id number] Date of Birth: 08-03-1970

## 2018-08-20 NOTE — Patient Instructions (Signed)
Access Code: [email or web address]  URL: [email or web address]  Date: 08/20/2018  Prepared by: Ruben Im   Exercises  Standing Lumbar Extension - 20 reps - 8x daily - 7x weekly  Clamshell - 10 reps - 2 sets - 1x daily - 7x weekly  Prone Alternating Arm and Leg Lifts - 10 reps - 1 sets - 2x daily - 7x weekly  Supine Bridge - 10 reps - 2 sets - 5 hold - 1x daily - 7x weekly  Supine Sciatic Nerve Glide - 10 reps - 1 sets - 1x daily - 7x weekly  Right Standing Lateral Shift Correction at Wall - Repetitions - 10 reps - 1 sets - 1x daily - 7x weekly  Hooklying Transversus Abdominis Palpation - 10 reps - 1 sets - 1x daily - 7x weekly  Supine Transversus Abdominis Bracing with Double Leg Fallout - 10 reps - 1 sets - 1x daily - 7x weekly  Supine Transversus Abdominis Bracing with Heel Slide - 10 reps - 1 sets - 1x daily - 7x weekly

## 2018-08-22 ENCOUNTER — Encounter: Payer: Self-pay | Admitting: Physical Therapy

## 2018-08-22 ENCOUNTER — Ambulatory Visit: Payer: 59 | Admitting: Physical Therapy

## 2018-08-22 DIAGNOSIS — M5442 Lumbago with sciatica, left side: Secondary | ICD-10-CM

## 2018-08-22 DIAGNOSIS — M6281 Muscle weakness (generalized): Secondary | ICD-10-CM | POA: Diagnosis not present

## 2018-08-22 NOTE — Therapy (Signed)
[facility] [address], [place], [place], [address] Phone: [phone]   Fax:  [phone]  Physical Therapy Treatment  Patient Details  Name: Christopher Osborne MRN: [id number] Date of Birth: [birth year] Referring Provider (PT): Dr. Sherley Bounds   Encounter Date: 08/22/2018  PT End of Session - 08/22/18 2003    Visit Number  5    Date for PT Re-Evaluation  10/03/18    Authorization Type  UMR no visit limit    PT Start Time  0800    PT Stop Time  0847    PT Time Calculation (min)  47 min    Activity Tolerance  Patient tolerated treatment well       Past Medical History:  Diagnosis Date  . Arm fracture, left 1994  . GERD (gastroesophageal reflux disease)     Past Surgical History:  Procedure Laterality Date  . ESOPHAGOGASTRODUODENOSCOPY N/A 12/18/2013   Procedure: ESOPHAGOGASTRODUODENOSCOPY (EGD);  Surgeon: Milus Banister, MD;  Location: [facility];  Service: Endoscopy;  Laterality: N/A;    There were no vitals filed for this visit.  Subjective Assessment - 08/22/18 0839    Subjective  It's the same.  I do a lot of standing extensions but maybe I don't do enough of the exercise.  Sitting in the car kills me.  Hard to stand up afterwards.  Foot continues to be numb but the pain in my hip bothers me more than that.      Currently in Pain?  Yes    Pain Score  5     Pain Location  Hip    Pain Orientation  Left                       [facility] Adult PT Treatment/Exercise - 08/22/18 0001      Lumbar Exercises: Standing   Heel Raises  15 reps    Other Standing Lumbar Exercises  UE supported self decompression       Lumbar Exercises: Seated   Other Seated Lumbar Exercises  seated flossing right/left 10x each       Lumbar Exercises: Sidelying   Other Sidelying Lumbar Exercises  sidelying over pillow with added flexion rotation 3 min       Lumbar Exercises: Prone   Other Prone Lumbar Exercises   press up with belt overpressure and exhalation 10x, roadkill 10x, hips offset 10x       Traction   Type of Traction  Lumbar    Min (lbs)  60    Max (lbs)  95    Hold Time  45    Rest Time  15    Time  15               PT Short Term Goals - 08/16/18 8242      PT SHORT TERM GOAL #1   Title  The patient will demonstrate knowledge of basic self care: use of lumbar roll, flexion avoidance, initial HEP    Baseline  reviewed again this session    Time  4    Period  Weeks    Status  Partially Met    Target Date  09/05/18      PT SHORT TERM GOAL #2   Title  The patient will report a 40% improvement of lumbar and left LE symptoms with home and work ADLs    Baseline  20%    Time  4  Period  Weeks    Status  Partially Met      PT SHORT TERM GOAL #3   Title  The patient will have improved lumbar flexion to 65 and bil sidebending ROM to 30 degrees bil needed for housework and taking care of his kids    Time  4    Period  Weeks    Status  New        PT Long Term Goals - 08/08/18 1715      PT LONG TERM GOAL #1   Title  The patient will be independent in safe self progression of HEP     Time  8    Period  Weeks    Status  New    Target Date  10/03/18      PT LONG TERM GOAL #2   Title  The patient will report a 70% improvement in lumbar and left LE symptoms with usual ADLs    Time  8    Period  Weeks    Status  New      PT LONG TERM GOAL #3   Title  Lumbo/pelvic/hip and left LE strength grossly 4+/5 needed for home and work ADLs    Time  8    Period  Weeks    Status  New      PT LONG TERM GOAL #4   Title  FOTO functional outcome score improved from 69% limitation to 42% indicating improved function with less pain     Time  8    Period  Weeks    Status  New            Plan - 08/22/18 2004    Clinical Impression Statement  The patient continues to have peripheral symptoms in distal LE.  He reports lateral hip pain is better with extension ex's but  symptoms do not stay better.  Extension biased and added lateral compartment ex's do not fully centralize.  He has some more intense pain with first rep of flossing and with traction but symptoms improve with each repetition.  Therapist closely monitoring response with all interventions.      Rehab Potential  Good    Clinical Impairments Affecting Rehab Potential  none    PT Frequency  2x / week    PT Duration  8 weeks    PT Treatment/Interventions  ADLs/Self Care Home Management;Cryotherapy;Electrical Stimulation;Ultrasound;Traction;Moist Heat;Therapeutic exercise;Therapeutic activities;Patient/family education;Neuromuscular re-education;Manual techniques;Dry needling;Taping    PT Next Visit Plan  assess response to traction;  extension based exercise with overpressure;  lateral techniques as needed;   quad trunk strength;, dry needling to Lt gluteals as needed       Patient will benefit from skilled therapeutic intervention in order to improve the following deficits and impairments:  Pain, Postural dysfunction, Decreased activity tolerance, Decreased range of motion, Decreased strength  Visit Diagnosis: Acute left-sided low back pain with left-sided sciatica  Muscle weakness (generalized)     Problem List Patient Active Problem List   Diagnosis Date Noted  . FOOT PAIN, RIGHT 07/15/2008   Ruben Im, PT 08/22/18 8:16 PM Phone: [phone] Fax: [phone] Alvera Singh 08/22/2018, 8:15 PM  [facility] [address], [place], [place], [address] Phone: [phone]   Fax:  [phone]  Name: Christopher Osborne MRN: [id number] Date of Birth: May 08, 1971

## 2018-08-27 ENCOUNTER — Ambulatory Visit: Payer: 59

## 2018-08-27 DIAGNOSIS — M6281 Muscle weakness (generalized): Secondary | ICD-10-CM

## 2018-08-27 DIAGNOSIS — M5442 Lumbago with sciatica, left side: Secondary | ICD-10-CM | POA: Diagnosis not present

## 2018-08-27 NOTE — Patient Instructions (Signed)

## 2018-08-27 NOTE — Therapy (Signed)
[facility] [address] [place], [place], [address] Phone: [phone]   Fax:  [phone]  Physical Therapy Treatment  Patient Details  Name: Christopher Osborne MRN: [id number] Date of Birth: [birth year] Referring Provider (PT): Dr. Sherley Bounds   Encounter Date: 08/27/2018  PT End of Session - 08/27/18 0804    Visit Number  6    Date for PT Re-Evaluation  10/03/18    Authorization Type  UMR no visit limit    PT Start Time  0730    PT Stop Time  0817    PT Time Calculation (min)  47 min    Activity Tolerance  Patient tolerated treatment well    Behavior During Therapy  [facility] for tasks assessed/performed       Past Medical History:  Diagnosis Date  . Arm fracture, left 1994  . GERD (gastroesophageal reflux disease)     Past Surgical History:  Procedure Laterality Date  . ESOPHAGOGASTRODUODENOSCOPY N/A 12/18/2013   Procedure: ESOPHAGOGASTRODUODENOSCOPY (EGD);  Surgeon: Milus Banister, MD;  Location: [facility];  Service: Endoscopy;  Laterality: N/A;    There were no vitals filed for this visit.  Subjective Assessment - 08/27/18 0728    Subjective  I am stiff in the morning.  50% overall improvement.  Sitting is the hardest for me.    Currently in Pain?  Yes    Pain Score  3     Pain Location  Hip    Pain Orientation  Left    Pain Descriptors / Indicators  Aching;Sore;Shooting;Burning    Pain Type  Chronic pain    Pain Onset  More than a month ago    Pain Frequency  Constant    Aggravating Factors   sitting, morning hours, sleep at night    Pain Relieving Factors  walking, standing, nerve glides                       [facility] Adult PT Treatment/Exercise - 08/27/18 0001      Traction   Type of Traction  Lumbar    Min (lbs)  60    Max (lbs)  105    Hold Time  45    Rest Time  15    Time  15      Manual Therapy   Manual Therapy  Joint mobilization;Soft tissue mobilization    Manual  therapy comments  soft tissue elongation and trigger point release to Lt gluteals and bil lumbar paraspinals       Trigger Point Dry Needling - 08/27/18 0733    Consent Given?  Yes    Education Handout Provided  Yes    Muscles Treated Lower Body  Gluteus minimus;Gluteus maximus;Piriformis   bil lumbar multifidi, Lt gluteals   Gluteus Maximus Response  Twitch response elicited;Palpable increased muscle length    Gluteus Minimus Response  Twitch response elicited;Palpable increased muscle length    Piriformis Response  Twitch response elicited;Palpable increased muscle length           PT Education - 08/27/18 0732    Education Details  dry needling information    Person(s) Educated  Patient    Methods  Explanation;Demonstration;Handout    Comprehension  Verbalized understanding;Returned demonstration       PT Short Term Goals - 08/16/18 0921      PT SHORT TERM GOAL #1   Title  The patient will demonstrate knowledge of basic self care: use  of lumbar roll, flexion avoidance, initial HEP    Baseline  reviewed again this session    Time  4    Period  Weeks    Status  Partially Met    Target Date  09/05/18      PT SHORT TERM GOAL #2   Title  The patient will report a 40% improvement of lumbar and left LE symptoms with home and work ADLs    Baseline  20%    Time  4    Period  Weeks    Status  Partially Met      PT SHORT TERM GOAL #3   Title  The patient will have improved lumbar flexion to 65 and bil sidebending ROM to 30 degrees bil needed for housework and taking care of his kids    Time  4    Period  Weeks    Status  New        PT Long Term Goals - 08/08/18 1715      PT LONG TERM GOAL #1   Title  The patient will be independent in safe self progression of HEP     Time  8    Period  Weeks    Status  New    Target Date  10/03/18      PT LONG TERM GOAL #2   Title  The patient will report a 70% improvement in lumbar and left LE symptoms with usual ADLs    Time   8    Period  Weeks    Status  New      PT LONG TERM GOAL #3   Title  Lumbo/pelvic/hip and left LE strength grossly 4+/5 needed for home and work ADLs    Time  8    Period  Weeks    Status  New      PT LONG TERM GOAL #4   Title  FOTO functional outcome score improved from 69% limitation to 42% indicating improved function with less pain     Time  8    Period  Weeks    Status  New            Plan - 08/27/18 3299    Clinical Impression Statement  Pt reports 50% improvement in frequency of Lt LE radiculopathy over the past week.  Pt is limited to sitting long periods of time.  Pt with tension and trigger points in Lt gluteals and Lt>Rt lumbar multifidi.  Pt demonstrated improved tissue mobility and reduced pain after dry needling today.  Pt tolerated advancement of lumbar traction today.  Pt will continue to benefit from skilled PT to address Lt LE radiculopathy.      Rehab Potential  Good    PT Frequency  2x / week    PT Duration  8 weeks    PT Treatment/Interventions  ADLs/Self Care Home Management;Cryotherapy;Electrical Stimulation;Ultrasound;Traction;Moist Heat;Therapeutic exercise;Therapeutic activities;Patient/family education;Neuromuscular re-education;Manual techniques;Dry needling;Taping    PT Next Visit Plan  assess response to dry needling, Addaday to Lt gluteals, continue traction, extension and nerve flossing    PT Home Exercise Plan   Access Code: [email or web address]     Recommended Other Services  initial certification is signed    Consulted and Agree with Plan of Care  Patient       Patient will benefit from skilled therapeutic intervention in order to improve the following deficits and impairments:  Pain, Postural dysfunction, Decreased activity tolerance, Decreased range of motion, Decreased strength  Visit  Diagnosis: Acute left-sided low back pain with left-sided sciatica  Muscle weakness (generalized)     Problem List Patient Active Problem List   Diagnosis Date  Noted  . FOOT PAIN, RIGHT 07/15/2008    Sigurd Sos, PT 08/27/18 8:08 AM  [facility] [address], [place], [place], [address] Phone: [phone]   Fax:  [phone]  Name: DALONTE HARDAGE MRN: [id number] Date of Birth: [birth year]

## 2018-08-29 ENCOUNTER — Ambulatory Visit: Payer: 59

## 2018-08-29 DIAGNOSIS — M5442 Lumbago with sciatica, left side: Secondary | ICD-10-CM | POA: Diagnosis not present

## 2018-08-29 DIAGNOSIS — M6281 Muscle weakness (generalized): Secondary | ICD-10-CM

## 2018-08-29 NOTE — Therapy (Signed)
[facility] [address], [place], [place], [address] Phone: [phone]   Fax:  [phone]  Physical Therapy Treatment  Patient Details  Name: Christopher Osborne MRN: [id number] Date of Birth: 10/15/1970 Referring Provider (PT): Dr. Sherley Bounds   Encounter Date: 08/29/2018  PT End of Session - 08/29/18 0802    Visit Number  7    Date for PT Re-Evaluation  10/03/18    Authorization Type  UMR no visit limit    PT Start Time  0730    PT Stop Time  0815    PT Time Calculation (min)  45 min    Activity Tolerance  Patient tolerated treatment well    Behavior During Therapy  [facility] for tasks assessed/performed       Past Medical History:  Diagnosis Date  . Arm fracture, left 1994  . GERD (gastroesophageal reflux disease)     Past Surgical History:  Procedure Laterality Date  . ESOPHAGOGASTRODUODENOSCOPY N/A 12/18/2013   Procedure: ESOPHAGOGASTRODUODENOSCOPY (EGD);  Surgeon: Milus Banister, MD;  Location: [facility];  Service: Endoscopy;  Laterality: N/A;    There were no vitals filed for this visit.  Subjective Assessment - 08/29/18 0732    Subjective  I am always worse in the morning.  No significant change since last session.      Patient Stated Goals  relieve some of this pain; be able to function    Currently in Pain?  Yes    Pain Score  6     Pain Location  Hip    Pain Orientation  Left    Pain Descriptors / Indicators  Aching    Pain Type  Chronic pain                       [facility] Adult PT Treatment/Exercise - 08/29/18 0001      Lumbar Exercises: Standing   Heel Raises  15 reps      Lumbar Exercises: Supine   Ab Set  10 reps    AB Set Limitations  with ball squeeze      Knee/Hip Exercises: Supine   Bridges  Strengthening;Both;2 sets;10 reps    Bridges with [employer]  Strengthening;Both;2 sets;10 reps      Knee/Hip Exercises: Prone   Hip Extension  Strengthening;Both;2 sets;10  reps      Traction   Type of Traction  Lumbar    Min (lbs)  60    Max (lbs)  105    Hold Time  45    Rest Time  15    Time  15      Manual Therapy   Manual Therapy  Soft tissue mobilization;Myofascial release    Manual therapy comments  addaday to Lt gluteals with blue attachment               PT Short Term Goals - 08/16/18 8676      PT SHORT TERM GOAL #1   Title  The patient will demonstrate knowledge of basic self care: use of lumbar roll, flexion avoidance, initial HEP    Baseline  reviewed again this session    Time  4    Period  Weeks    Status  Partially Met    Target Date  09/05/18      PT SHORT TERM GOAL #2   Title  The patient will report a 40% improvement of lumbar and left LE symptoms with home and  work ADLs    Baseline  20%    Time  4    Period  Weeks    Status  Partially Met      PT SHORT TERM GOAL #3   Title  The patient will have improved lumbar flexion to 65 and bil sidebending ROM to 30 degrees bil needed for housework and taking care of his kids    Time  4    Period  Weeks    Status  New        PT Long Term Goals - 08/08/18 1715      PT LONG TERM GOAL #1   Title  The patient will be independent in safe self progression of HEP     Time  8    Period  Weeks    Status  New    Target Date  10/03/18      PT LONG TERM GOAL #2   Title  The patient will report a 70% improvement in lumbar and left LE symptoms with usual ADLs    Time  8    Period  Weeks    Status  New      PT LONG TERM GOAL #3   Title  Lumbo/pelvic/hip and left LE strength grossly 4+/5 needed for home and work ADLs    Time  8    Period  Weeks    Status  New      PT LONG TERM GOAL #4   Title  FOTO functional outcome score improved from 69% limitation to 42% indicating improved function with less pain     Time  8    Period  Weeks    Status  New            Plan - 08/29/18 3704    Clinical Impression Statement  Pt with increased symptoms today.  Bridging exercise  resulted in Lt gluteal and LBP.  Pt denies any significant change after dry needling last session.  Pt continues to have limitations with sitting and stands at work most of the day.  Pt tolerated increased traction this week and pain is more proximal in the gluteals now.  Pt will continue to benefit from skilled PT to address Lt LE radiculopathy.      Rehab Potential  Good    PT Frequency  2x / week    PT Duration  8 weeks    PT Treatment/Interventions  ADLs/Self Care Home Management;Cryotherapy;Electrical Stimulation;Ultrasound;Traction;Moist Heat;Therapeutic exercise;Therapeutic activities;Patient/family education;Neuromuscular re-education;Manual techniques;Dry needling;Taping    PT Next Visit Plan   Addaday to Lt gluteals, dry needling if helpful,continue traction, extension and nerve flossing    PT Home Exercise Plan   Access Code: [email or web address]     Consulted and Agree with Plan of Care  Patient       Patient will benefit from skilled therapeutic intervention in order to improve the following deficits and impairments:  Pain, Postural dysfunction, Decreased activity tolerance, Decreased range of motion, Decreased strength  Visit Diagnosis: Acute left-sided low back pain with left-sided sciatica  Muscle weakness (generalized)     Problem List Patient Active Problem List   Diagnosis Date Noted  . FOOT PAIN, RIGHT 07/15/2008    Christopher Osborne, PT 08/29/18 8:06 AM  [facility] [address], [place], [place], [address] Phone: [phone]   Fax:  [phone]  Name: Christopher Osborne MRN: [id number] Date of Birth: 09-Feb-1971

## 2018-09-03 ENCOUNTER — Encounter: Payer: Self-pay | Admitting: Physical Therapy

## 2018-09-03 ENCOUNTER — Ambulatory Visit: Payer: 59 | Attending: Neurological Surgery | Admitting: Physical Therapy

## 2018-09-03 DIAGNOSIS — M5442 Lumbago with sciatica, left side: Secondary | ICD-10-CM | POA: Diagnosis not present

## 2018-09-03 DIAGNOSIS — M6281 Muscle weakness (generalized): Secondary | ICD-10-CM | POA: Diagnosis not present

## 2018-09-03 NOTE — Patient Instructions (Addendum)
Pt given handout for prone multifidus activation.   Prone pelvic press  10x right and left each Prone pelvic press with knee flex  Right and left 10 x each and then bilaterally x 10 Prone pelvic press with hip extension right and left 10 x each Prone pelvic press with knee flex and hip ext Right and left 10 times each Upper body sequence  Start with pelvic press  Do 10 reps with arms in  T, W M Y shape keep neck in neutral position Lift head and arms up . Then lift upper body.  See handout given in clinic Access Code: [email or web address]  URL: [email or web address]  Date: 09/03/2018  Prepared by: Ruben Im   Exercises  Standing Lumbar Extension - 20 reps - 8x daily - 7x weekly  Clamshell - 10 reps - 2 sets - 1x daily - 7x weekly  Prone Alternating Arm and Leg Lifts - 10 reps - 1 sets - 2x daily - 7x weekly  Supine Bridge - 10 reps - 2 sets - 5 hold - 1x daily - 7x weekly  Supine Sciatic Nerve Glide - 10 reps - 1 sets - 1x daily - 7x weekly  Right Standing Lateral Shift Correction at Wall - Repetitions - 10 reps - 1 sets - 1x daily - 7x weekly  Hooklying Transversus Abdominis Palpation - 10 reps - 1 sets - 1x daily - 7x weekly  Supine Transversus Abdominis Bracing with Double Leg Fallout - 10 reps - 1 sets - 1x daily - 7x weekly  Supine Transversus Abdominis Bracing with Heel Slide - 10 reps - 1 sets - 1x daily - 7x weekly  Prone Knee Flexion - 10 reps - 1 sets - 1x daily - 7x weekly  Prone Hip Extension - One Pillow - 10 reps - 1 sets - 1x daily - 7x weekly  Prone Hip Extension with Bent Knee - 10 reps - 1 sets - 1x daily - 7x weekly

## 2018-09-03 NOTE — Therapy (Signed)
[facility] [address] [place], [place], [address] Phone: [phone]   Fax:  [phone]  Physical Therapy Treatment  Patient Details  Name: Christopher Osborne MRN: [id number] Date of Birth: 02/06/1971 Referring Provider (PT): Dr. Sherley Bounds   Encounter Date: 09/03/2018  PT End of Session - 09/03/18 1711    Visit Number  8    Date for PT Re-Evaluation  10/03/18    Authorization Type  UMR no visit limit    PT Start Time  0730    PT Stop Time  0815    PT Time Calculation (min)  45 min    Activity Tolerance  Patient tolerated treatment well       Past Medical History:  Diagnosis Date  . Arm fracture, left 1994  . GERD (gastroesophageal reflux disease)     Past Surgical History:  Procedure Laterality Date  . ESOPHAGOGASTRODUODENOSCOPY N/A 12/18/2013   Procedure: ESOPHAGOGASTRODUODENOSCOPY (EGD);  Surgeon: Milus Banister, MD;  Location: [facility];  Service: Endoscopy;  Laterality: N/A;    There were no vitals filed for this visit.  Subjective Assessment - 09/03/18 0728    Subjective  Worse in the mornings.  "Brutal."  Numbness in foot and leg pain.  Felt more sensitive after increasing traction last time.  Not stabbing but dull pain in hip.  Numbness in leg.  Pain top of foot.  Friday more centralized.      Currently in Pain?  Yes    Pain Score  4     Pain Location  Hip    Pain Orientation  Left    Pain Type  Chronic pain    Aggravating Factors   mornings    Pain Relieving Factors  moving around                       [facility] Adult PT Treatment/Exercise - 09/03/18 0001      Lumbar Exercises: Standing   Other Standing Lumbar Exercises  doorway sideglide right 15x      Lumbar Exercises: Prone   Other Prone Lumbar Exercises  press up with belt overpressure and exhalation 3x 10     Other Prone Lumbar Exercises  multifidi series 8-10 x pelvic press with HS curl, hip ext and bent knee extension  over 1 pillow right and left     Lumbar Exercises: Quadruped   Madcat/Old Horse  10 reps    Other Quadruped Lumbar Exercises  press up into childs pose 5x       Traction   Type of Traction  Lumbar    Min (lbs)  50    Max (lbs)  100    Hold Time  45    Rest Time  15    Time  15             PT Education - 09/03/18 1710    Education Details  prone multifidi series over 1 pillow     Person(s) Educated  Patient    Methods  Explanation;Demonstration;Handout    Comprehension  Returned demonstration;Verbalized understanding       PT Short Term Goals - 09/03/18 1715      PT SHORT TERM GOAL #1   Title  The patient will demonstrate knowledge of basic self care: use of lumbar roll, flexion avoidance, initial HEP    Status  Achieved      PT SHORT TERM GOAL #2   Title  The patient  will report a 40% improvement of lumbar and left LE symptoms with home and work ADLs    Time  4    Period  Weeks    Status  Partially Met      PT SHORT TERM GOAL #3   Title  The patient will have improved lumbar flexion to 65 and bil sidebending ROM to 30 degrees bil needed for housework and taking care of his kids    Time  4    Period  Weeks    Status  New        PT Long Term Goals - 08/08/18 1715      PT LONG TERM GOAL #1   Title  The patient will be independent in safe self progression of HEP     Time  8    Period  Weeks    Status  New    Target Date  10/03/18      PT LONG TERM GOAL #2   Title  The patient will report a 70% improvement in lumbar and left LE symptoms with usual ADLs    Time  8    Period  Weeks    Status  New      PT LONG TERM GOAL #3   Title  Lumbo/pelvic/hip and left LE strength grossly 4+/5 needed for home and work ADLs    Time  8    Period  Weeks    Status  New      PT LONG TERM GOAL #4   Title  FOTO functional outcome score improved from 69% limitation to 42% indicating improved function with less pain     Time  8    Period  Weeks    Status  New             Plan - 09/03/18 1711    Clinical Impression Statement  The patient reports partial relief of symptoms following high repetition of prone lumbar extensions and moderate overpressure.  He reports he feels the best following extension biased ex vs. lateral compartment ex.  Unable to fully centralize symptoms.  Discussed best ex for early AM (press ups) and avoidance of flexion and nerve flossing too early in the AM.  Encouraged lots of walking.  Therapist closely monitoring response with all interventions.      Rehab Potential  Good    Clinical Impairments Affecting Rehab Potential  none    PT Frequency  2x / week    PT Duration  8 weeks    PT Treatment/Interventions  ADLs/Self Care Home Management;Cryotherapy;Electrical Stimulation;Ultrasound;Traction;Moist Heat;Therapeutic exercise;Therapeutic activities;Patient/family education;Neuromuscular re-education;Manual techniques;Dry needling;Taping    PT Next Visit Plan  Check STGS;  Extension biased ex with overpressure high reps;  core stabilization;  try standing pallof series;  assess response to traction;  recheck FOTO     PT Home Exercise Plan   Access Code: [email or web address]        Patient will benefit from skilled therapeutic intervention in order to improve the following deficits and impairments:  Pain, Postural dysfunction, Decreased activity tolerance, Decreased range of motion, Decreased strength  Visit Diagnosis: Acute left-sided low back pain with left-sided sciatica  Muscle weakness (generalized)     Problem List Patient Active Problem List   Diagnosis Date Noted  . FOOT PAIN, RIGHT 07/15/2008   Ruben Im, PT 09/03/18 5:18 PM Phone: [phone] Fax: [phone] Alvera Singh 09/03/2018, 5:17 PM  [facility] [address] [place],  [place], [address] Phone: [phone]   Fax:  [phone]  Name: Christopher Osborne MRN: [id number] Date of Birth:  [birth year]

## 2018-09-05 ENCOUNTER — Ambulatory Visit: Payer: 59 | Admitting: Physical Therapy

## 2018-09-10 ENCOUNTER — Ambulatory Visit: Payer: 59 | Admitting: Physical Therapy

## 2018-09-12 ENCOUNTER — Ambulatory Visit: Payer: 59 | Admitting: Physical Therapy

## 2018-09-17 ENCOUNTER — Ambulatory Visit: Payer: 59 | Admitting: Physical Therapy

## 2018-09-17 ENCOUNTER — Other Ambulatory Visit: Payer: Self-pay

## 2018-09-17 DIAGNOSIS — M5442 Lumbago with sciatica, left side: Secondary | ICD-10-CM

## 2018-09-17 DIAGNOSIS — M6281 Muscle weakness (generalized): Secondary | ICD-10-CM | POA: Diagnosis not present

## 2018-09-17 NOTE — Therapy (Signed)
[facility] [address], [place], [place], [address] Phone: [phone]   Fax:  [phone]  Physical Therapy Treatment  Patient Details  Name: Christopher Osborne MRN: [id number] Date of Birth: 1971/01/15 Referring Provider (PT): Dr. Sherley Bounds   Encounter Date: 09/17/2018  PT End of Session - 09/17/18 0926    Visit Number  9    Date for PT Re-Evaluation  10/03/18    Authorization Type  UMR no visit limit    PT Start Time  0833    PT Stop Time  0915    PT Time Calculation (min)  42 min    Activity Tolerance  Patient tolerated treatment well       Past Medical History:  Diagnosis Date  . Arm fracture, left 1994  . GERD (gastroesophageal reflux disease)     Past Surgical History:  Procedure Laterality Date  . ESOPHAGOGASTRODUODENOSCOPY N/A 12/18/2013   Procedure: ESOPHAGOGASTRODUODENOSCOPY (EGD);  Surgeon: Milus Banister, MD;  Location: [facility];  Service: Endoscopy;  Laterality: N/A;    There were no vitals filed for this visit.  Subjective Assessment - 09/17/18 0836    Subjective  50% better.  7-8/10 pain in the mornings.  Takes about an hour to improve.  Been doing press ups, SKTC, pelvic tilts.   Constant foot numbness and leg pain.  Had to cancel appt with MD last week, will call to reschedule.  I don't want an MRI.      Currently in Pain?  Yes    Pain Score  2     Pain Location  Hip    Pain Orientation  Left         [facility] PT Assessment - 09/17/18 0001      Observation/Other Assessments   Focus on Therapeutic Outcomes (FOTO)   37% limitation       Posture/Postural Control   Posture Comments  mild right lateral shift initially       AROM   Lumbar Flexion  68    Lumbar Extension  22      Slump test   Findings  Positive    Side  Left    Comment  10 degrees      Straight Leg Raise   Findings  Positive    Side   Left    Comment  15 degrees                    [facility] Adult PT  Treatment/Exercise - 09/17/18 0001      Self-Care   Posture  discussion of walking, best ex's, adding overpressure, physiology of healing       Lumbar Exercises: Standing   Other Standing Lumbar Exercises  wall side glides 15x2       Lumbar Exercises: Prone   Other Prone Lumbar Exercises  press up with belt overpressure and exhalation 2x 10       Manual Therapy   Manual therapy comments  lateral shift correction manual assist 20x in standing    Joint Mobilization  PA lumbar mobs grade 3 5x 20 sec L1-L5 ; PA pressure with prone press ups               PT Short Term Goals - 09/17/18 1140      PT SHORT TERM GOAL #1   Title  The patient will demonstrate knowledge of basic self care: use of lumbar roll, flexion avoidance, initial HEP    Status  Achieved      PT SHORT TERM GOAL #2   Title  The patient will report a 40% improvement of lumbar and left LE symptoms with home and work ADLs    Status  Achieved      PT SHORT TERM GOAL #3   Title  The patient will have improved lumbar flexion to 65 and bil sidebending ROM to 30 degrees bil needed for housework and taking care of his kids    Time  4    Period  Weeks    Status  Partially Met        PT Long Term Goals - 08/08/18 1715      PT LONG TERM GOAL #1   Title  The patient will be independent in safe self progression of HEP     Time  8    Period  Weeks    Status  New    Target Date  10/03/18      PT LONG TERM GOAL #2   Title  The patient will report a 70% improvement in lumbar and left LE symptoms with usual ADLs    Time  8    Period  Weeks    Status  New      PT LONG TERM GOAL #3   Title  Lumbo/pelvic/hip and left LE strength grossly 4+/5 needed for home and work ADLs    Time  8    Period  Weeks    Status  New      PT LONG TERM GOAL #4   Title  FOTO functional outcome score improved from 69% limitation to 42% indicating improved function with less pain     Time  8    Period  Weeks    Status  New             Plan - 09/17/18 3329    Clinical Impression Statement  The patient reports a subjective improvement of 50% despite continued left LE symptoms present  on a constant basis.  His lumbar ROM has improved with both flexion and extension.  Extension preference however this does not centralize his symptoms.  Mild right lateral shift upon arrival, improved post session.  Lateral shift correction, like extension, decreases symptom intensity but does not centralize.  +neural signs with slump and SLR on left side persist.  Therapist closely monitoring response with all treatment interventions.      Rehab Potential  Good    Clinical Impairments Affecting Rehab Potential  none    PT Frequency  2x / week    PT Duration  8 weeks    PT Treatment/Interventions  ADLs/Self Care Home Management;Cryotherapy;Electrical Stimulation;Ultrasound;Traction;Moist Heat;Therapeutic exercise;Therapeutic activities;Patient/family education;Neuromuscular re-education;Manual techniques;Dry needling;Taping    PT Next Visit Plan  Check for lateral shift;   Extension biased ex with overpressure high reps;  core stabilization;  try standing pallof series; standing UBE     PT Home Exercise Plan   Access Code: [email or web address]        Patient will benefit from skilled therapeutic intervention in order to improve the following deficits and impairments:  Pain, Postural dysfunction, Decreased activity tolerance, Decreased range of motion, Decreased strength  Visit Diagnosis: Acute left-sided low back pain with left-sided sciatica  Muscle weakness (generalized)     Problem List Patient Active Problem List   Diagnosis Date Noted  . FOOT PAIN, RIGHT 07/15/2008   Ruben Im, PT 09/17/18 11:43 AM Phone: [phone] Fax: [phone]  Alvera Singh 09/17/2018, 11:41 AM  [facility] [address] [place], [place], [address] Phone: [phone]   Fax:   [phone]  Name: BRAYLON GRENDA MRN: [id number] Date of Birth: [birth year]

## 2018-09-19 ENCOUNTER — Encounter: Payer: 59 | Admitting: Physical Therapy

## 2018-09-24 ENCOUNTER — Encounter: Payer: 59 | Admitting: Physical Therapy

## 2018-10-01 ENCOUNTER — Ambulatory Visit: Payer: 59

## 2018-10-15 ENCOUNTER — Encounter: Payer: 59 | Admitting: Physical Therapy

## 2018-10-17 ENCOUNTER — Other Ambulatory Visit: Payer: Self-pay

## 2018-10-17 ENCOUNTER — Encounter: Payer: Self-pay | Admitting: Physical Therapy

## 2018-10-17 ENCOUNTER — Ambulatory Visit: Payer: 59 | Attending: Neurological Surgery | Admitting: Physical Therapy

## 2018-10-17 DIAGNOSIS — M5442 Lumbago with sciatica, left side: Secondary | ICD-10-CM | POA: Diagnosis not present

## 2018-10-17 DIAGNOSIS — M6281 Muscle weakness (generalized): Secondary | ICD-10-CM | POA: Insufficient documentation

## 2018-10-17 NOTE — Therapy (Signed)
[facility] [address] [place], [place], [address] Phone: [phone]   Fax:  [phone]  Physical Therapy Treatment/Discharge Summary   Patient Details  Name: Christopher Osborne MRN: [id number] Date of Birth: 03-10-1971 Referring Provider (PT): Dr. Sherley Bounds   Encounter Date: 10/17/2018  PT End of Session - 10/17/18 0957    Visit Number  10    Date for PT Re-Evaluation  10/17/18    Authorization Type  UMR no visit limit    PT Start Time  0750    PT Stop Time  0835    PT Time Calculation (min)  45 min    Activity Tolerance  Patient tolerated treatment well       Past Medical History:  Diagnosis Date  . Arm fracture, left 1994  . GERD (gastroesophageal reflux disease)     Past Surgical History:  Procedure Laterality Date  . ESOPHAGOGASTRODUODENOSCOPY N/A 12/18/2013   Procedure: ESOPHAGOGASTRODUODENOSCOPY (EGD);  Surgeon: Milus Banister, MD;  Location: [facility];  Service: Endoscopy;  Laterality: N/A;    There were no vitals filed for this visit.  Subjective Assessment - 10/17/18 0757    Subjective  Feeling better.  Things have improved a lot.  I haven't been doing the ex.  Mild LBP and foot numbness.  Unsure if constant.  Have not seen the doctor.  Working around the house. I can do 100# lifting around the house without difficulty.  My neck is hurting now.  I have bulging disc in my neck.  Not driving to work.  I do have a stand up desk at work.    (Pended)     How long can you sit comfortably?  Hours  (Pended)     How long can you walk comfortably?  as long as I want to.   (Pended)     Currently in Pain?  Yes  (Pended)     Pain Score  2   (Pended)     Pain Location  Back  (Pended)     Pain Type  Chronic pain  (Pended)     Pain Radiating Towards  numbness  (Pended)          [facility] PT Assessment - 10/17/18 0001      Observation/Other Assessments   Focus on Therapeutic Outcomes (FOTO)   21%       AROM   Lumbar Flexion  75    Lumbar Extension  22    Lumbar - Right Side Bend  35    Lumbar - Left Side Bend  35      Strength   Right Hip Flexion  5/5    Right Hip ABduction  5/5    Left Hip ABduction  4+/5    Lumbar Flexion  4+/5    Lumbar Extension  4+/5      Slump test   Side  Left    Comment  full ROM but with mild pain at endrange       Straight Leg Raise   Side   Left    Comment  45   mild pain                   [facility] Adult PT Treatment/Exercise - 10/17/18 0001      Lumbar Exercises: Standing   Other Standing Lumbar Exercises  hip hinge 15x    Other Standing Lumbar Exercises  dead lifting 10#, 25# 30# 10x each to mid shin and  floor with ease      Lumbar Exercises: Seated   Other Seated Lumbar Exercises  neural flossing      Lumbar Exercises: Supine   Other Supine Lumbar Exercises  compartmental neural flossing: ankle PF/DF, knee flexion/extension with varying ranges of hip flexion       Lumbar Exercises: Prone   Other Prone Lumbar Exercises  press up with belt overpressure and exhalation 2x 10     Other Prone Lumbar Exercises  Biering Sorenson test 1 minute       Manual Therapy   Manual therapy comments  left leg off side of table PA with knee flexion, pressure off with knee extension 15x               PT Short Term Goals - 10/17/18 1003      PT SHORT TERM GOAL #1   Title  The patient will demonstrate knowledge of basic self care: use of lumbar roll, flexion avoidance, initial HEP    Status  Achieved      PT SHORT TERM GOAL #2   Title  The patient will report a 40% improvement of lumbar and left LE symptoms with home and work ADLs    Status  Achieved      PT SHORT TERM GOAL #3   Title  The patient will have improved lumbar flexion to 65 and bil sidebending ROM to 30 degrees bil needed for housework and taking care of his kids    Status  Achieved        PT Long Term Goals - 10/17/18 1003      PT LONG TERM GOAL #1   Title  The  patient will be independent in safe self progression of HEP     Status  Achieved      PT LONG TERM GOAL #2   Title  The patient will report a 70% improvement in lumbar and left LE symptoms with usual ADLs    Status  Achieved      PT LONG TERM GOAL #3   Title  Lumbo/pelvic/hip and left LE strength grossly 4+/5 needed for home and work ADLs    Status  Achieved      PT LONG TERM GOAL #4   Title  FOTO functional outcome score improved from 69% limitation to 42% indicating improved function with less pain     Status  Achieved            Plan - 10/17/18 0957    Clinical Impression Statement  The patient has made excellent improvements in pain reduction, symptoms presence and return to function.  He rates his overall improvement at 80%.  He is able to sit for "hours" now and is walking longer distances.  He has been working in his yard and in the clinic today he is able to dead lift 30# from the floor with relative ease without symptom reduction.  His lumbar ROM is full and painless.  He has mild neural symptoms with SLR at 40 degrees and Slump test at nearly full knee extension.  Discussed home compartmental neural flossing to address this problem.  The patient expresses readiness for discharge from PT at this time to an independent HEP.  We have discussed a basic HEP for further strengthening.  All rehab goals have been met.  Discharge from PT at this time.           PHYSICAL THERAPY DISCHARGE SUMMARY  Visits from Start of Care: 10  Current  functional level related to goals / functional outcomes: See clinical impressions above   Remaining deficits: As above  Education / Equipment: HEP Plan: Patient agrees to discharge.  Patient goals were met. Patient is being discharged due to meeting the stated rehab goals.  ?????        Patient will benefit from skilled therapeutic intervention in order to improve the following deficits and impairments:     Visit Diagnosis: Acute  left-sided low back pain with left-sided sciatica - Plan: PT plan of care cert/re-cert  Muscle weakness (generalized) - Plan: PT plan of care cert/re-cert     Problem List Patient Active Problem List   Diagnosis Date Noted  . FOOT PAIN, RIGHT 07/15/2008   Ruben Im, PT 10/17/18 11:13 AM Phone: [phone] Fax: [phone] Alvera Singh 10/17/2018, 11:12 AM  [facility] [address] [place], [place], [address] Phone: [phone]   Fax:  [phone]  Name: Christopher Osborne MRN: [id number] Date of Birth: Sep 14, 1970

## 2018-12-29 DIAGNOSIS — Z20828 Contact with and (suspected) exposure to other viral communicable diseases: Secondary | ICD-10-CM | POA: Diagnosis not present

## 2019-01-29 DIAGNOSIS — Z20828 Contact with and (suspected) exposure to other viral communicable diseases: Secondary | ICD-10-CM | POA: Diagnosis not present

## 2019-06-11 MED FILL — NICOTINE 14 MG/24HR PATCH: 14 | 14 days supply | Qty: 14 | Fill #0

## 2019-06-17 DIAGNOSIS — E7849 Other hyperlipidemia: Secondary | ICD-10-CM | POA: Diagnosis not present

## 2019-06-17 DIAGNOSIS — Z125 Encounter for screening for malignant neoplasm of prostate: Secondary | ICD-10-CM | POA: Diagnosis not present

## 2019-06-20 DIAGNOSIS — R82998 Other abnormal findings in urine: Secondary | ICD-10-CM | POA: Diagnosis not present

## 2019-06-24 DIAGNOSIS — K219 Gastro-esophageal reflux disease without esophagitis: Secondary | ICD-10-CM | POA: Diagnosis not present

## 2019-06-24 DIAGNOSIS — Z1331 Encounter for screening for depression: Secondary | ICD-10-CM | POA: Diagnosis not present

## 2019-06-24 DIAGNOSIS — Z0000 Encounter for general adult medical examination without abnormal findings: Secondary | ICD-10-CM | POA: Diagnosis not present

## 2019-06-24 DIAGNOSIS — E785 Hyperlipidemia, unspecified: Secondary | ICD-10-CM | POA: Diagnosis not present

## 2019-06-24 DIAGNOSIS — Z720 Tobacco use: Secondary | ICD-10-CM | POA: Diagnosis not present

## 2019-06-24 DIAGNOSIS — Z20828 Contact with and (suspected) exposure to other viral communicable diseases: Secondary | ICD-10-CM | POA: Diagnosis not present

## 2019-06-30 MED FILL — NICOTINE 14 MG/24HR PATCH: 14 | 28 days supply | Qty: 28 | Fill #1

## 2019-08-11 MED FILL — NICOTINE 14 MG/24HR PATCH: 14 | 14 days supply | Qty: 14 | Fill #2

## 2019-08-11 MED FILL — NICOTINE 7 MG/24HR PATCH: 7 | 14 days supply | Qty: 14 | Fill #0

## 2019-08-28 MED FILL — NICOTINE 14 MG/24HR PATCH: 14 | 14 days supply | Qty: 14 | Fill #3

## 2019-09-10 DIAGNOSIS — Z23 Encounter for immunization: Secondary | ICD-10-CM | POA: Diagnosis not present

## 2019-09-12 MED FILL — NICOTINE 7 MG/24HR PATCH: 7 | 14 days supply | Qty: 14 | Fill #0

## 2019-09-12 MED FILL — NICOTINE 14 MG/24HR PATCH: 14 | 14 days supply | Qty: 14 | Fill #4

## 2020-02-24 ENCOUNTER — Other Ambulatory Visit: Payer: Self-pay

## 2020-02-24 ENCOUNTER — Other Ambulatory Visit: Payer: 59

## 2020-02-24 DIAGNOSIS — Z20822 Contact with and (suspected) exposure to covid-19: Secondary | ICD-10-CM | POA: Diagnosis not present

## 2020-02-25 LAB — NOVEL CORONAVIRUS, NAA: SARS-CoV-2, NAA: NOT DETECTED

## 2020-02-25 LAB — SARS-COV-2, NAA 2 DAY TAT

## 2020-03-02 DIAGNOSIS — Z20828 Contact with and (suspected) exposure to other viral communicable diseases: Secondary | ICD-10-CM | POA: Diagnosis not present

## 2020-03-29 ENCOUNTER — Encounter: Payer: Self-pay | Admitting: Gastroenterology

## 2020-05-24 ENCOUNTER — Ambulatory Visit: Payer: 59 | Admitting: Gastroenterology

## 2020-05-24 ENCOUNTER — Encounter: Payer: Self-pay | Admitting: Gastroenterology

## 2020-05-24 DIAGNOSIS — K219 Gastro-esophageal reflux disease without esophagitis: Secondary | ICD-10-CM

## 2020-05-24 DIAGNOSIS — R1310 Dysphagia, unspecified: Secondary | ICD-10-CM | POA: Diagnosis not present

## 2020-05-24 MED ORDER — FAMOTIDINE 20 MG PO TABS: ORAL_TABLET | ORAL | Status: DC

## 2020-05-24 NOTE — Patient Instructions (Addendum)
If you are age 49 or older, your body mass index should be between 23-30. Your Body mass index is 28.62 kg/m. If this is out of the aforementioned range listed, please consider follow up with your Primary Care Provider.  If you are age 9 or younger, your body mass index should be between 19-25. Your Body mass index is 28.62 kg/m. If this is out of the aformentioned range listed, please consider follow up with your Primary Care Provider.   You have been scheduled for an endoscopy. Please follow written instructions given to you at your visit today. If you use inhalers (even only as needed), please bring them with you on the day of your procedure.  Due to recent changes in healthcare laws, you may see the results of your imaging and laboratory studies on MyChart before your provider has had a chance to review them.  We understand that in some cases there may be results that are confusing or concerning to you. Not all laboratory results come back in the same time frame and the provider may be waiting for multiple results in order to interpret others.  Please give [place] 48 hours in order for your provider to thoroughly review all the results before contacting the office for clarification of your results.   Please purchase the following medications over the counter and take as directed:  START: Pecid (famotidine) 20mg  take one tablet at breakfast and one tablet at bedtime.  Thank you for entrusting me with your care and choosing [facility].  Dr Ardis Hughs

## 2020-05-24 NOTE — Progress Notes (Signed)
HPI: This is a very pleasant 49 year old man whom I last saw about 6 years ago   I did an upper endoscopy for him in 2015 for globus sensation and odynophagia.  The examination was completely normal except for significantly large tonsils.  I recommended that he consider ENT evaluation if twice daily proton pump inhibitors were not helpful.  We have not heard back from him since that appointment.Christopher Osborne  He is here today to discuss similar symptoms.  He says this always happens when he quit smoking.  He has a burning in his esophagus, burning in his chest with eating.  He has dysphagia intermittently with solid intake.  He feels food usually going down slowly.  He does have a globus sensation and is getting sore throats.  He tried Prilosec over-the-counter for 2 to 3 months and he thinks it helped somewhat but then he became depressed and was having chest pains and he thought that the medicine was the cause and so he stopped.  Those symptoms both improved.  He has been taking Pepcid intermittently since then and it is not as helpful.  He has probably gained 30 pounds in the past 8 to 10 months.  No abdominal pains, no troubles with his bowels.   Review of systems: Pertinent positive and negative review of systems were noted in the above HPI section. All other review negative.   Past Medical History:  Diagnosis Date  . Arm fracture, left 1994  . GERD (gastroesophageal reflux disease)     Past Surgical History:  Procedure Laterality Date  . ESOPHAGOGASTRODUODENOSCOPY N/A 12/18/2013   Procedure: ESOPHAGOGASTRODUODENOSCOPY (EGD);  Surgeon: Milus Banister, MD;  Location: [facility];  Service: Endoscopy;  Laterality: N/A;  . UPPER GASTROINTESTINAL ENDOSCOPY      Current Outpatient Medications  Medication Sig Dispense Refill  . Multiple Vitamin (MULTIVITAMIN) tablet Take 1 tablet by mouth daily.     No current facility-administered medications for this visit.    Allergies as of  05/24/2020  . (No Known Allergies)    Family History  Problem Relation Age of Onset  . Cervical cancer Mother   . Stroke Paternal Grandmother   . Lung cancer Paternal Grandfather   . Heart disease Maternal Grandmother   . Stroke Maternal Grandfather   . Colon cancer Neg Hx   . Pancreatic cancer Neg Hx   . Esophageal cancer Neg Hx     Social History   Socioeconomic History  . Marital status: Married    Spouse name: Not on file  . Number of children: 2  . Years of education: Not on file  . Highest education level: Not on file  Occupational History  . Occupation: HR Manager  Tobacco Use  . Smoking status: Former Smoker    Types: Cigarettes    Quit date: 10/05/2013    Years since quitting: 6.6  . Smokeless tobacco: Never Used  Substance and Sexual Activity  . Alcohol use: Yes    Comment: 2 per day  . Drug use: No  . Sexual activity: Not on file  Other Topics Concern  . Not on file  Social History Narrative  . Not on file   Social Determinants of Health   Financial Resource Strain:   . Difficulty of Paying Living Expenses: Not on file  Food Insecurity:   . Worried About Charity fundraiser in the Last Year: Not on file  . Ran Out of Food in the Last Year: Not on  file  Transportation Needs:   . Film/video editor (Medical): Not on file  . Lack of Transportation (Non-Medical): Not on file  Physical Activity:   . Days of Exercise per Week: Not on file  . Minutes of Exercise per Session: Not on file  Stress:   . Feeling of Stress : Not on file  Social Connections:   . Frequency of Communication with Friends and Family: Not on file  . Frequency of Social Gatherings with Friends and Family: Not on file  . Attends Religious Services: Not on file  . Active Member of Clubs or Organizations: Not on file  . Attends Archivist Meetings: Not on file  . Marital Status: Not on file  Intimate Partner Violence:   . Fear of Current or Ex-Partner: Not on file  .  Emotionally Abused: Not on file  . Physically Abused: Not on file  . Sexually Abused: Not on file     Physical Exam: Ht 6' (1.829 m)   Wt 211 lb (95.7 kg)   BMI 28.62 kg/m  Constitutional: generally well-appearing Psychiatric: alert and oriented x3 Eyes: extraocular movements intact Mouth: oral pharynx moist, no lesions: Very large tonsils bilaterally and oropharynx Neck: supple no lymphadenopathy Cardiovascular: heart regular rate and rhythm Lungs: clear to auscultation bilaterally Abdomen: soft, nontender, nondistended, no obvious ascites, no peritoneal signs, normal bowel sounds Extremities: no lower extremity edema bilaterally Skin: no lesions on visible extremities   Assessment and plan: 49 y.o. male with GERD, dysphagia, globus  He may have been having side effects from proton pump inhibitors and so I recommended he start taking H2 blockers on a more regular basis and in fact I recommended Pepcid, famotidine 20 mg pills 1 pill with breakfast and 1 pill at bedtime every night.  I recommended repeat upper endoscopy at his soonest convenience.  He still has quite impressively large tonsils and I wonder if these are contributing to any of his throat, globus, dysphagia symptoms.   Please see the "Patient Instructions" section for addition details about the plan.   Owens Loffler, MD [facility] 05/24/2020, 2:51 PM  Cc: Velna Hatchet, MD  Total time on date of encounter was 45  minutes (this included time spent preparing to see the patient reviewing records; obtaining and/or reviewing separately obtained history; performing a medically appropriate exam and/or evaluation; counseling and educating the patient and family if present; ordering medications, tests or procedures if applicable; and documenting clinical information in the health record).

## 2020-06-08 DIAGNOSIS — J069 Acute upper respiratory infection, unspecified: Secondary | ICD-10-CM | POA: Diagnosis not present

## 2020-06-08 DIAGNOSIS — R509 Fever, unspecified: Secondary | ICD-10-CM | POA: Diagnosis not present

## 2020-06-08 DIAGNOSIS — Z20822 Contact with and (suspected) exposure to covid-19: Secondary | ICD-10-CM | POA: Diagnosis not present

## 2020-06-08 DIAGNOSIS — R059 Cough, unspecified: Secondary | ICD-10-CM | POA: Diagnosis not present

## 2020-06-11 ENCOUNTER — Encounter: Payer: Self-pay | Admitting: Physician Assistant

## 2020-06-11 DIAGNOSIS — Z6828 Body mass index (BMI) 28.0-28.9, adult: Secondary | ICD-10-CM | POA: Insufficient documentation

## 2020-06-12 ENCOUNTER — Other Ambulatory Visit: Payer: Self-pay | Admitting: Nurse Practitioner

## 2020-06-12 ENCOUNTER — Ambulatory Visit (HOSPITAL_COMMUNITY)
Admission: RE | Admit: 2020-06-12 | Discharge: 2020-06-12 | Disposition: A | Discharge status: Home / Self Care | Payer: 59 | Source: Ambulatory Visit | Attending: Pulmonary Disease | Admitting: Pulmonary Disease

## 2020-06-12 DIAGNOSIS — U071 COVID-19: Secondary | ICD-10-CM | POA: Diagnosis not present

## 2020-06-12 DIAGNOSIS — Z6828 Body mass index (BMI) 28.0-28.9, adult: Secondary | ICD-10-CM

## 2020-06-12 MED ORDER — FAMOTIDINE IN NACL 20-0.9 MG/50ML-% IV SOLN: 20.0000 mg | Freq: Once | INTRAVENOUS | Status: DC | PRN

## 2020-06-12 MED ORDER — METHYLPREDNISOLONE SODIUM SUCC 125 MG IJ SOLR: 125.0000 mg | Freq: Once | INTRAMUSCULAR | Status: DC | PRN

## 2020-06-12 MED ORDER — SODIUM CHLORIDE 0.9 % IV SOLN
1200.0000 mg | Freq: Once | INTRAVENOUS | Status: AC
  Administered 2020-06-12: 1200 mg via INTRAVENOUS
  Filled 2020-06-12: qty 10

## 2020-06-12 MED ORDER — ALBUTEROL SULFATE HFA 108 (90 BASE) MCG/ACT IN AERS: 2.0000 | INHALATION_SPRAY | Freq: Once | RESPIRATORY_TRACT | Status: DC | PRN

## 2020-06-12 MED ORDER — EPINEPHRINE 0.3 MG/0.3ML IJ SOAJ: 0.3000 mg | Freq: Once | INTRAMUSCULAR | Status: DC | PRN

## 2020-06-12 MED ORDER — DIPHENHYDRAMINE HCL 50 MG/ML IJ SOLN: 50.0000 mg | Freq: Once | INTRAMUSCULAR | Status: DC | PRN

## 2020-06-12 MED ORDER — SODIUM CHLORIDE 0.9 % IV SOLN: INTRAVENOUS | Status: DC | PRN

## 2020-06-12 MED ORDER — SODIUM CHLORIDE 0.9 % IV SOLN: Freq: Once | INTRAVENOUS | Status: DC

## 2020-06-12 NOTE — Discharge Instructions (Signed)
10 Things You Can Do to Manage Your COVID-19 Symptoms at Home If you have possible or confirmed COVID-19: 1. Stay home from work and school. And stay away from other public places. If you must go out, avoid using any kind of public transportation, ridesharing, or taxis. 2. Monitor your symptoms carefully. If your symptoms get worse, call your healthcare provider immediately. 3. Get rest and stay hydrated. 4. If you have a medical appointment, call the healthcare provider ahead of time and tell them that you have or may have COVID-19. 5. For medical emergencies, call 911 and notify the dispatch personnel that you have or may have COVID-19. 6. Cover your cough and sneezes with a tissue or use the inside of your elbow. 7. Wash your hands often with soap and water for at least 20 seconds or clean your hands with an alcohol-based hand sanitizer that contains at least 60% alcohol. 8. As much as possible, stay in a specific room and away from other people in your home. Also, you should use a separate bathroom, if available. If you need to be around other people in or outside of the home, wear a mask. 9. Avoid sharing personal items with other people in your household, like dishes, towels, and bedding. 10. Clean all surfaces that are touched often, like counters, tabletops, and doorknobs. Use household cleaning sprays or wipes according to the label instructions. cdc.gov/coronavirus 01/01/2019 This information is not intended to replace advice given to you by your health care provider. Make sure you discuss any questions you have with your health care provider. Document Revised: 06/05/2019 Document Reviewed: 06/05/2019 Elsevier Patient Education  2020 Elsevier Inc. What types of side effects do monoclonal antibody drugs cause?  Common side effects  In general, the more common side effects caused by monoclonal antibody drugs include: . Allergic reactions, such as hives or itching . Flu-like signs and  symptoms, including chills, fatigue, fever, and muscle aches and pains . Nausea, vomiting . Diarrhea . Skin rashes . Low blood pressure   The CDC is recommending patients who receive monoclonal antibody treatments wait at least 90 days before being vaccinated.  Currently, there are no data on the safety and efficacy of mRNA COVID-19 vaccines in persons who received monoclonal antibodies or convalescent plasma as part of COVID-19 treatment. Based on the estimated half-life of such therapies as well as evidence suggesting that reinfection is uncommon in the 90 days after initial infection, vaccination should be deferred for at least 90 days, as a precautionary measure until additional information becomes available, to avoid interference of the antibody treatment with vaccine-induced immune responses. If you have any questions or concerns after the infusion please call the Advanced Practice Provider on call at [phone]. This number is ONLY intended for your use regarding questions or concerns about the infusion post-treatment side-effects.  Please do not provide this number to others for use. For return to work notes please contact your primary care provider.   If someone you know is interested in receiving treatment please have them call the COVID hotline at [phone].   

## 2020-06-12 NOTE — Progress Notes (Signed)
I connected by phone with Christopher Osborne on 06/12/2020 at 1:42 PM to discuss the potential use of a new treatment for mild to moderate COVID-19 viral infection in non-hospitalized patients.  This patient is a 49 y.o. male that meets the FDA criteria for Emergency Use Authorization of COVID monoclonal antibody casirivimab/imdevimab, bamlanivimab/eteseviamb, or sotrovimab.  Has a (+) direct SARS-CoV-2 viral test result  Has mild or moderate COVID-19   Is NOT hospitalized due to COVID-19  Is within 10 days of symptom onset  Has at least one of the high risk factor(s) for progression to severe COVID-19 and/or hospitalization as defined in EUA.  Specific high risk criteria : BMI > 25   I have spoken and communicated the following to the patient or parent/caregiver regarding COVID monoclonal antibody treatment:  1. FDA has authorized the emergency use for the treatment of mild to moderate COVID-19 in adults and pediatric patients with positive results of direct SARS-CoV-2 viral testing who are 2 years of age and older weighing at least 40 kg, and who are at high risk for progressing to severe COVID-19 and/or hospitalization.  2. The significant known and potential risks and benefits of COVID monoclonal antibody, and the extent to which such potential risks and benefits are unknown.  3. Information on available alternative treatments and the risks and benefits of those alternatives, including clinical trials.  4. Patients treated with COVID monoclonal antibody should continue to self-isolate and use infection control measures (e.g., wear mask, isolate, social distance, avoid sharing personal items, clean and disinfect "high touch" surfaces, and frequent handwashing) according to CDC guidelines.   5. The patient or parent/caregiver has the option to accept or refuse COVID monoclonal antibody treatment.  After reviewing this information with the patient, the patient has agreed to receive one of  the available covid 19 monoclonal antibodies and will be provided an appropriate fact sheet prior to infusion. Jobe Gibbon, NP 06/12/2020 1:42 PM

## 2020-06-12 NOTE — Progress Notes (Signed)
Patient reviewed Fact Sheet for Patients, Parents, and Caregivers for Emergency Use Authorization (EUA) of Reg-Cov for the Treatment of Coronavirus. Patient also reviewed and is agreeable to the estimated cost of treatment. Patient is agreeable to proceed.

## 2020-06-12 NOTE — Progress Notes (Signed)
  Diagnosis: COVID-19  Physician: Asencion Noble, MD  Procedure: Cov[facility] Infusion Clinic Med: casirivimab\imdevimab infusion - Provided patient with casirivimab\imdevimab fact sheet for patients, parents and caregivers prior to infusion.  Complications: No immediate complications noted.  Discharge: Discharged home   Christopher Osborne 06/12/2020

## 2020-07-09 ENCOUNTER — Encounter: Payer: Self-pay | Admitting: Gastroenterology

## 2020-07-09 ENCOUNTER — Other Ambulatory Visit: Payer: Self-pay

## 2020-07-09 ENCOUNTER — Ambulatory Visit (AMBULATORY_SURGERY_CENTER): Payer: 59 | Admitting: Gastroenterology

## 2020-07-09 VITALS — BP 114/76 | HR 58 | Temp 96.9°F | Resp 13 | Ht 72.0 in | Wt 211.0 lb

## 2020-07-09 DIAGNOSIS — K219 Gastro-esophageal reflux disease without esophagitis: Secondary | ICD-10-CM

## 2020-07-09 DIAGNOSIS — R1013 Epigastric pain: Secondary | ICD-10-CM | POA: Diagnosis not present

## 2020-07-09 DIAGNOSIS — K2980 Duodenitis without bleeding: Secondary | ICD-10-CM | POA: Diagnosis not present

## 2020-07-09 DIAGNOSIS — R1310 Dysphagia, unspecified: Secondary | ICD-10-CM

## 2020-07-09 DIAGNOSIS — K2970 Gastritis, unspecified, without bleeding: Secondary | ICD-10-CM

## 2020-07-09 DIAGNOSIS — K2950 Unspecified chronic gastritis without bleeding: Secondary | ICD-10-CM | POA: Diagnosis not present

## 2020-07-09 MED ORDER — SODIUM CHLORIDE 0.9 % IV SOLN: 500.0000 mL | Freq: Once | INTRAVENOUS | Status: DC

## 2020-07-09 NOTE — Progress Notes (Signed)
A/ox3, pleased with MAC, report to RN 

## 2020-07-09 NOTE — Patient Instructions (Signed)
Information on gastritis given to you today.  Resume previous diet.  Resume previous medications.  Resume Pepcid 20 mg pills on pill twice each day.  Await pathology results.  YOU HAD AN ENDOSCOPIC PROCEDURE TODAY AT [facility]:   Refer to the procedure report that was given to you for any specific questions about what was found during the examination.  If the procedure report does not answer your questions, please call your gastroenterologist to clarify.  If you requested that your care partner not be given the details of your procedure findings, then the procedure report has been included in a sealed envelope for you to review at your convenience later.  YOU SHOULD EXPECT: Some feelings of bloating in the abdomen. Passage of more gas than usual.  Walking can help get rid of the air that was put into your GI tract during the procedure and reduce the bloating. If you had a lower endoscopy (such as a colonoscopy or flexible sigmoidoscopy) you may notice spotting of blood in your stool or on the toilet paper. If you underwent a bowel prep for your procedure, you may not have a normal bowel movement for a few days.  Please Note:  You might notice some irritation and congestion in your nose or some drainage.  This is from the oxygen used during your procedure.  There is no need for concern and it should clear up in a day or so.  SYMPTOMS TO REPORT IMMEDIATELY:   Following upper endoscopy (EGD)  Vomiting of blood or coffee ground material  New chest pain or pain under the shoulder blades  Painful or persistently difficult swallowing  New shortness of breath  Fever of 100F or higher  Black, tarry-looking stools  For urgent or emergent issues, a gastroenterologist can be reached at any hour by calling [phone]. Do not use MyChart messaging for urgent concerns.    DIET:  We do recommend a small meal at first, but then you may proceed to your regular diet.  Drink plenty  of fluids but you should avoid alcoholic beverages for 24 hours.  ACTIVITY:  You should plan to take it easy for the rest of today and you should NOT DRIVE or use heavy machinery until tomorrow (because of the sedation medicines used during the test).    FOLLOW UP: Our staff will call the number listed on your records 48-72 hours following your procedure to check on you and address any questions or concerns that you may have regarding the information given to you following your procedure. If we do not reach you, we will leave a message.  We will attempt to reach you two times.  During this call, we will ask if you have developed any symptoms of COVID 19. If you develop any symptoms (ie: fever, flu-like symptoms, shortness of breath, cough etc.) before then, please call [phone].  If you test positive for Covid 19 in the 2 weeks post procedure, please call and report this information to [place].    If any biopsies were taken you will be contacted by phone or by letter within the next 1-3 weeks.  Please call us at [phone] if you have not heard about the biopsies in 3 weeks.    SIGNATURES/CONFIDENTIALITY: You and/or your care partner have signed paperwork which will be entered into your electronic medical record.  These signatures attest to the fact that that the information above on your After Visit Summary has been reviewed and is  understood.  Full responsibility of the confidentiality of this discharge information lies with you and/or your care-partner. 

## 2020-07-09 NOTE — Op Note (Signed)
[facility] Patient Name: Christopher Osborne Procedure Date: 07/09/2020 9:54 AM MRN: [id number] Endoscopist: Milus Banister , MD Age: 50 Referring MD:  Date of Birth: 05/30/1971 Gender: Male Account #: [id number] Procedure:                Upper GI endoscopy Indications:              Dysphagia, Heartburn, globus Medicines:                Monitored Anesthesia Care Procedure:                Pre-Anesthesia Assessment:                           - Prior to the procedure, a History and Physical                            was performed, and patient medications and                            allergies were reviewed. The patient's tolerance of                            previous anesthesia was also reviewed. The risks                            and benefits of the procedure and the sedation                            options and risks were discussed with the patient.                            All questions were answered, and informed consent                            was obtained. Prior Anticoagulants: The patient has                            taken no previous anticoagulant or antiplatelet                            agents. ASA Grade Assessment: II - A patient with                            mild systemic disease. After reviewing the risks                            and benefits, the patient was deemed in                            satisfactory condition to undergo the procedure.                           After obtaining informed consent, the endoscope was  passed under direct vision. Throughout the                            procedure, the patient's blood pressure, pulse, and                            oxygen saturations were monitored continuously. The                            Endoscope was introduced through the mouth, and                            advanced to the second part of duodenum. The upper                            GI endoscopy was accomplished  without difficulty.                            The patient tolerated the procedure well. Scope In: Scope Out: Findings:                 LA Grade A reflux type distal esophagitis.                           Moderate inflammation characterized by erythema,                            friability and granularity was found in the gastric                            antrum. Biopsies were taken with a cold forceps for                            histology.                           7-81mm typical appearing pancreatic rest: submucosal                            nodule with small umbilication.                           Bulbar duodenitis.                           The exam was otherwise without abnormality. Complications:            No immediate complications. Estimated blood loss:                            None. Estimated Blood Loss:     Estimated blood loss: none. Impression:               - GERD related mild esophagitis.                           - Moderate inflammation characterized by erythema,  friability and granularity was found in the gastric                            antrum. Biopsies were taken with a cold forceps for                            histology.                           - 7-63mm typical appearing pancreatic rest:                            submucosal nodule with small umbilication.                           - Bulbar duodenitis.                           - The examination was otherwise normal. Recommendation:           - Patient has a contact number available for                            emergencies. The signs and symptoms of potential                            delayed complications were discussed with the                            patient. Return to normal activities tomorrow.                            Written discharge instructions were provided to the                            patient.                           - Resume previous diet.                            - Continue present medications. Please resume the                            pepcid 20mg  pills, one pill twice daily.                           - Await pathology results. If biopsies are + for H.                            pylori will treat with appropriate antibiotics. Milus Banister, MD 07/09/2020 10:06:09 AM This report has been signed electronically.

## 2020-07-09 NOTE — Progress Notes (Signed)
Medical history reviewed with no changes noted. VS assessed by [facility]

## 2020-07-12 DIAGNOSIS — Z125 Encounter for screening for malignant neoplasm of prostate: Secondary | ICD-10-CM | POA: Diagnosis not present

## 2020-07-12 DIAGNOSIS — Z0000 Encounter for general adult medical examination without abnormal findings: Secondary | ICD-10-CM | POA: Diagnosis not present

## 2020-07-12 DIAGNOSIS — E785 Hyperlipidemia, unspecified: Secondary | ICD-10-CM | POA: Diagnosis not present

## 2020-07-13 ENCOUNTER — Telehealth: Payer: Self-pay

## 2020-07-13 NOTE — Telephone Encounter (Signed)
  Follow up Call-  Call back number 07/09/2020  Post procedure Call Back phone  # [phone]  Permission to leave phone message Yes  Some recent data might be hidden     Patient questions:  Do you have a fever, pain , or abdominal swelling? No. Pain Score  0 *  Have you tolerated food without any problems? Yes.    Have you been able to return to your normal activities? Yes.    Do you have any questions about your discharge instructions: Diet   No. Medications  No. Follow up visit  No.  Do you have questions or concerns about your Care? No.  Actions: * If pain score is 4 or above: No action needed, pain <4.  1. Have you developed a fever since your procedure? no  2.   Have you had an respiratory symptoms (SOB or cough) since your procedure? no  3.   Have you tested positive for COVID 19 since your procedure no  4.   Have you had any family members/close contacts diagnosed with the COVID 19 since your procedure?  no   If yes to any of these questions please route to Joylene John, RN and Joella Prince, RN

## 2020-07-16 ENCOUNTER — Encounter: Payer: Self-pay | Admitting: Gastroenterology

## 2020-07-16 DIAGNOSIS — R7989 Other specified abnormal findings of blood chemistry: Secondary | ICD-10-CM | POA: Diagnosis not present

## 2020-07-16 DIAGNOSIS — E785 Hyperlipidemia, unspecified: Secondary | ICD-10-CM | POA: Diagnosis not present

## 2020-07-16 DIAGNOSIS — R6882 Decreased libido: Secondary | ICD-10-CM | POA: Diagnosis not present

## 2020-07-16 DIAGNOSIS — Z87891 Personal history of nicotine dependence: Secondary | ICD-10-CM | POA: Diagnosis not present

## 2020-07-16 DIAGNOSIS — K2950 Unspecified chronic gastritis without bleeding: Secondary | ICD-10-CM | POA: Diagnosis not present

## 2020-07-16 DIAGNOSIS — Z0000 Encounter for general adult medical examination without abnormal findings: Secondary | ICD-10-CM | POA: Diagnosis not present

## 2020-07-16 DIAGNOSIS — Z8616 Personal history of COVID-19: Secondary | ICD-10-CM | POA: Diagnosis not present

## 2020-07-16 DIAGNOSIS — R82998 Other abnormal findings in urine: Secondary | ICD-10-CM | POA: Diagnosis not present

## 2020-07-20 DIAGNOSIS — Z1212 Encounter for screening for malignant neoplasm of rectum: Secondary | ICD-10-CM | POA: Diagnosis not present

## 2020-08-12 ENCOUNTER — Other Ambulatory Visit (HOSPITAL_COMMUNITY): Payer: Self-pay | Admitting: Internal Medicine

## 2020-08-19 DIAGNOSIS — L509 Urticaria, unspecified: Secondary | ICD-10-CM | POA: Diagnosis not present

## 2020-08-30 ENCOUNTER — Ambulatory Visit (HOSPITAL_COMMUNITY)
Admission: RE | Admit: 2020-08-30 | Discharge: 2020-08-30 | Disposition: A | Discharge status: Home / Self Care | Payer: 59 | Source: Ambulatory Visit | Attending: Internal Medicine | Admitting: Internal Medicine

## 2020-08-30 ENCOUNTER — Other Ambulatory Visit: Payer: Self-pay | Admitting: Internal Medicine

## 2020-08-30 ENCOUNTER — Other Ambulatory Visit: Payer: Self-pay

## 2020-08-30 DIAGNOSIS — R1031 Right lower quadrant pain: Secondary | ICD-10-CM

## 2020-08-30 DIAGNOSIS — R102 Pelvic and perineal pain: Secondary | ICD-10-CM | POA: Diagnosis not present

## 2020-08-30 DIAGNOSIS — K760 Fatty (change of) liver, not elsewhere classified: Secondary | ICD-10-CM | POA: Diagnosis not present

## 2020-08-30 DIAGNOSIS — L509 Urticaria, unspecified: Secondary | ICD-10-CM | POA: Diagnosis not present

## 2020-08-30 MED ORDER — IOHEXOL 300 MG/ML  SOLN
100.0000 mL | Freq: Once | INTRAMUSCULAR | Status: AC | PRN
  Administered 2020-08-30: 100 mL via INTRAVENOUS

## 2020-09-20 MED FILL — PANTOPRAZOLE SOD DR 40 MG T: 40 | 30 days supply | Qty: 60 | Fill #0

## 2020-10-18 ENCOUNTER — Other Ambulatory Visit (HOSPITAL_COMMUNITY): Payer: Self-pay

## 2020-10-18 MED FILL — Pantoprazole Sodium EC Tab 40 MG (Base Equiv): ORAL | 30 days supply | Qty: 60 | Fill #0 | Status: CN

## 2020-10-21 ENCOUNTER — Other Ambulatory Visit (HOSPITAL_COMMUNITY): Payer: Self-pay

## 2020-10-21 MED FILL — Pantoprazole Sodium EC Tab 40 MG (Base Equiv): ORAL | 60 days supply | Qty: 120 | Fill #0 | Status: AC

## 2020-11-01 ENCOUNTER — Encounter: Payer: Self-pay | Admitting: Nurse Practitioner

## 2020-11-01 ENCOUNTER — Other Ambulatory Visit: Payer: Self-pay

## 2020-11-01 ENCOUNTER — Other Ambulatory Visit (INDEPENDENT_AMBULATORY_CARE_PROVIDER_SITE_OTHER): Payer: 59

## 2020-11-01 ENCOUNTER — Other Ambulatory Visit (HOSPITAL_COMMUNITY): Payer: Self-pay

## 2020-11-01 ENCOUNTER — Ambulatory Visit: Payer: 59 | Admitting: Nurse Practitioner

## 2020-11-01 VITALS — BP 134/80 | HR 68 | Ht 72.0 in | Wt 210.0 lb

## 2020-11-01 DIAGNOSIS — Z87891 Personal history of nicotine dependence: Secondary | ICD-10-CM | POA: Insufficient documentation

## 2020-11-01 DIAGNOSIS — K760 Fatty (change of) liver, not elsewhere classified: Secondary | ICD-10-CM | POA: Diagnosis not present

## 2020-11-01 DIAGNOSIS — R7989 Other specified abnormal findings of blood chemistry: Secondary | ICD-10-CM

## 2020-11-01 DIAGNOSIS — K5732 Diverticulitis of large intestine without perforation or abscess without bleeding: Secondary | ICD-10-CM | POA: Diagnosis not present

## 2020-11-01 DIAGNOSIS — Z8719 Personal history of other diseases of the digestive system: Secondary | ICD-10-CM

## 2020-11-01 DIAGNOSIS — K219 Gastro-esophageal reflux disease without esophagitis: Secondary | ICD-10-CM

## 2020-11-01 DIAGNOSIS — K2940 Chronic atrophic gastritis without bleeding: Secondary | ICD-10-CM | POA: Insufficient documentation

## 2020-11-01 LAB — HEPATIC FUNCTION PANEL
ALT: 61 U/L — ABNORMAL HIGH (ref 0–53)
AST: 29 U/L (ref 0–37)
Albumin: 4.4 g/dL (ref 3.5–5.2)
Alkaline Phosphatase: 66 U/L (ref 39–117)
Bilirubin, Direct: 0.1 mg/dL (ref 0.0–0.3)
Total Bilirubin: 0.6 mg/dL (ref 0.2–1.2)
Total Protein: 7.5 g/dL (ref 6.0–8.3)

## 2020-11-01 MED ORDER — SUPREP BOWEL PREP KIT 17.5-3.13-1.6 GM/177ML PO SOLN
1.0000 | ORAL | 0 refills | Status: DC
  Filled 2020-11-01: qty 354, 1d supply, fill #0

## 2020-11-01 NOTE — Progress Notes (Signed)
11/01/2020 Christopher Osborne [id number] 01-Oct-1970   Chief Complaint: Diverticulitis follow up  History of Present Illness: Christopher Osborne is a 50 year old male with a past medical history of GERD an hepatic steatosis. He was last seen in office by Dr. Ardis Hughs on 05/24/2020 with dysphagia and heartburn symptoms. He underwent a repeat EGD 07/09/2020 which identified mild GERD related esophagitis and gastritis.  His heartburn symptoms persisted on Famotidine therefore he was switched to Pantoprazole 40 mg p.o. twice daily.  He presents to our office today for further evaluation regarding abdominal pain and change in bowel pattern.  He developed central lower and left lower abdominal pain for few days. He underwent an abdominal/pelvic CT scan as ordered by Dr. Ardeth Perfect on 08/30/2020 which identified acute uncomplicated diverticulitis to the proximal sigmoid colon.  Hepatic steatosis was noted.  No prior history of diverticulitis.  He was prescribed Cipro 500 mg 1 p.o. twice daily and Metronidazole 500 mg 1 p.o. 3 times daily for 10 days and his lower abdominal pain resolved.  However, while he was on the antibiotics he developed loose stools, bloody mucus with increased urgency.  At one point, he was passing 5-6 loose stools daily.  His diarrhea and urgency resolved within a few weeks.  No further bloody mucus per the rectum.  He is now passing a softly formed brown bowel movement once daily.  He denies ever having a screening colonoscopy.  No known family history of colorectal cancer or IBD.  He quit smoking cigarettes 1/2 years ago and within that time he gained 30 pounds.  His reflux symptoms are better controlled on Pantoprazole 40 mg p.o. twice daily, however, he does not want to take a PPI long-term.  He has more noticeable acid reflux symptoms when he runs or cycles.  No dysphagia.  He intends to lose weight.  No NSAID use.  Laboratory studies 08/30/2020 at [facility]: Glucose 74.  BUN  14.  Creatinine 0.9.  Sodium 138.  Potassium 4.5.  WBC 10.  Hemoglobin 15.8.  Hematocrit 47.7.  Platelet 257.  Laboratory studies 07/12/2020: Alk phos 76.  Total bili 0.5.  AST 37.  ALT 103.  CTAP 08/30/2020: 1. Acute uncomplicated diverticulitis of the proximal sigmoid colon. No perforation, fluid collection, or abscess. 2. Normal appendix. 3. Hepatic steatosis.  EGD 07/09/2020: - GERD related mild esophagitis. - Moderate inflammation characterized by erythema, friability and granularity was found in the gastric antrum. Biopsies were taken with a cold forceps for histology. - 7-30mm typical appearing pancreatic rest: submucosal nodule with small umbilication. - Bulbar duodenitis. - The examination was otherwise normal. Biopsy Report: - GASTRIC ANTRAL AND OXYNTIC MUCOSA WITH MILD CHRONIC GASTRITIS - WARTHIN STARRY STAIN IS NEGATIVE FOR HELICOBACTER PYLORI  EGD 12/18/2013: Normal EGD Very large tonsils   Past Medical History:  Diagnosis Date  . Arm fracture, left 1994  . BMI 28.0-28.9,adult   . GERD (gastroesophageal reflux disease)    Past Surgical History:  Procedure Laterality Date  . ESOPHAGOGASTRODUODENOSCOPY N/A 12/18/2013   Procedure: ESOPHAGOGASTRODUODENOSCOPY (EGD);  Surgeon: Milus Banister, MD;  Location: [facility];  Service: Endoscopy;  Laterality: N/A;  . UPPER GASTROINTESTINAL ENDOSCOPY     Current Outpatient Medications on File Prior to Visit  Medication Sig Dispense Refill  . Multiple Vitamin (MULTIVITAMIN) tablet Take 1 tablet by mouth daily.    . pantoprazole (PROTONIX) 40 MG tablet TAKE 1 TABLET BY MOUTH TWICE DAILY 120 tablet 0   No current  facility-administered medications on file prior to visit.   No Known Allergies  Current Medications, Allergies, Past Medical History, Past Surgical History, Family History and Social History were reviewed in [employer] record.  Review of Systems:   Constitutional: Negative for fever, sweats,  chills or weight loss.  Respiratory: Negative for shortness of breath.   Cardiovascular: Negative for chest pain, palpitations and leg swelling.  Gastrointestinal: See HPI.  Musculoskeletal: Negative for back pain or muscle aches.  Neurological: Negative for dizziness, headaches or paresthesias.    Physical Exam: There were no vitals taken for this visit.  BP 134/80   Pulse 68   Ht 6' (1.829 m)   Wt 210 lb (95.3 kg)   BMI 28.48 kg/m   General: 50 year old male in no acute distress. Head: Normocephalic and atraumatic. Eyes: No scleral icterus. Conjunctiva pink . Ears: Normal auditory acuity. Mouth: Dentition intact. No ulcers or lesions. Tonsils 3+ without erythema or exudate. Lungs: Clear throughout to auscultation. Heart: Regular rate and rhythm, no murmur. Abdomen: Soft, nontender and nondistended. No masses or hepatomegaly. Normal bowel sounds x 4 quadrants.  Rectal: Deferred. Musculoskeletal: Symmetrical with no gross deformities. Extremities: No edema. Neurological: Alert oriented x 4. No focal deficits.  Psychological: Alert and cooperative. Normal mood and affect  Assessment and Recommendations: 1. GERD. EGD 07/09/20 showed mild reflux esophagitis and mild chronic gastritis without evidence of H. pylori. -Continue Pantoprazole 40 mg p.o. twice daily for total of 3 months then reduce to once daily if tolerated.  Patient wishes to eventually come off all GERD medications. -Take two Tums or Pepcid $RemoveB'20mg'[id number]$  one tab 30 minutes prior to exercise  -Exercise as tolerated, weight loss encouraged -GERD diet discussed   2. Diverticulitis to the proximal sigmoid colon per CTAP 08/2020, patient developed diarrhea, increased urgency and bloody mucous per the rectum while on Cipro and Flagy which abated.  -Colonoscopy to rule out colorectal cancer and IBD. Colonoscopy benefits and risks discussed including risk with sedation, risk of bleeding, perforation and infection  -Patient will call  our office if lower abdominal pain or diarrhea recurs  3. Hepatic steatosis per CTAP 08/2020. ALT level 103 with normal AST, alk phos and T. Bili level. CTAP 08/2020 showed evidence of hepatic steatosis -Weight loss and exercise as tolerated to reduce risk of developing fatty liver disease -Repeat hepatic panel, LFTs elevated he will require further hepatology serologies  Further follow-up to be determined after the above evaluation completed

## 2020-11-01 NOTE — Patient Instructions (Addendum)
If you are age 50 or younger, your body mass index should be between 19-25. Your Body mass index is 28.48 kg/m. If this is out of the aformentioned range listed, please consider follow up with your Primary Care Provider.   PROCEDURES: You have been scheduled for a colonoscopy. Please follow the written instructions given to you at your visit today. Please pick up your prep supplies at the pharmacy within the next 1-3 days. If you use inhalers (even only as needed), please bring them with you on the day of your procedure.  LABS:  Lab work has been ordered for you today. Our lab is located in the basement. Press "B" on the elevator. The lab is located at the first door on the left as you exit the elevator.  HEALTHCARE LAWS AND MY CHART RESULTS: Due to recent changes in healthcare laws, you may see the results of your imaging and laboratory studies on MyChart before your provider has had a chance to review them.   We understand that in some cases there may be results that are confusing or concerning to you. Not all laboratory results come back in the same time frame and the provider may be waiting for multiple results in order to interpret others.  Please give [place] 48 hours in order for your provider to thoroughly review all the results before contacting the office for clarification of your results.   RECOMMENDATIONS: Take Pepcid 20 MG or 2 Tums prior to exercise. Continue Pantoprazole 40 MG twice a day for 3 months, then decrease to once a day.  Please call our office if your symptoms worsen. It was great seeing you today! Thank you for entrusting me with your care and choosing [facility].  Noralyn Pick, CRNP

## 2020-11-02 NOTE — Progress Notes (Signed)
I agree with the above note, plan 

## 2020-11-25 DIAGNOSIS — R109 Unspecified abdominal pain: Secondary | ICD-10-CM | POA: Diagnosis not present

## 2020-11-26 ENCOUNTER — Encounter: Payer: Self-pay | Admitting: Gastroenterology

## 2020-12-03 ENCOUNTER — Encounter: Payer: Self-pay | Admitting: Gastroenterology

## 2020-12-03 ENCOUNTER — Ambulatory Visit (AMBULATORY_SURGERY_CENTER): Payer: 59 | Admitting: Gastroenterology

## 2020-12-03 ENCOUNTER — Other Ambulatory Visit: Payer: Self-pay

## 2020-12-03 VITALS — BP 118/82 | HR 60 | Temp 98.0°F | Resp 15 | Ht 72.0 in | Wt 210.0 lb

## 2020-12-03 DIAGNOSIS — K648 Other hemorrhoids: Secondary | ICD-10-CM

## 2020-12-03 DIAGNOSIS — K219 Gastro-esophageal reflux disease without esophagitis: Secondary | ICD-10-CM | POA: Diagnosis not present

## 2020-12-03 DIAGNOSIS — R945 Abnormal results of liver function studies: Secondary | ICD-10-CM | POA: Diagnosis not present

## 2020-12-03 DIAGNOSIS — D125 Benign neoplasm of sigmoid colon: Secondary | ICD-10-CM

## 2020-12-03 DIAGNOSIS — K635 Polyp of colon: Secondary | ICD-10-CM | POA: Diagnosis not present

## 2020-12-03 DIAGNOSIS — K760 Fatty (change of) liver, not elsewhere classified: Secondary | ICD-10-CM | POA: Diagnosis not present

## 2020-12-03 DIAGNOSIS — K5732 Diverticulitis of large intestine without perforation or abscess without bleeding: Secondary | ICD-10-CM | POA: Diagnosis not present

## 2020-12-03 DIAGNOSIS — E669 Obesity, unspecified: Secondary | ICD-10-CM | POA: Diagnosis not present

## 2020-12-03 MED ORDER — SODIUM CHLORIDE 0.9 % IV SOLN: 500.0000 mL | Freq: Once | INTRAVENOUS | Status: DC

## 2020-12-03 NOTE — Progress Notes (Signed)
Medical history reviewed with no changes noted since PV. VS assessed by C.W 

## 2020-12-03 NOTE — Patient Instructions (Signed)
YOU HAD AN ENDOSCOPIC PROCEDURE TODAY AT THE [facility]:   Refer to the procedure report that was given to you for any specific questions about what was found during the examination.  If the procedure report does not answer your questions, please call your gastroenterologist to clarify.  If you requested that your care partner not be given the details of your procedure findings, then the procedure report has been included in a sealed envelope for you to review at your convenience later.  YOU SHOULD EXPECT: Some feelings of bloating in the abdomen. Passage of more gas than usual.  Walking can help get rid of the air that was put into your GI tract during the procedure and reduce the bloating. If you had a lower endoscopy (such as a colonoscopy or flexible sigmoidoscopy) you may notice spotting of blood in your stool or on the toilet paper. If you underwent a bowel prep for your procedure, you may not have a normal bowel movement for a few days.  Please Note:  You might notice some irritation and congestion in your nose or some drainage.  This is from the oxygen used during your procedure.  There is no need for concern and it should clear up in a day or so.  SYMPTOMS TO REPORT IMMEDIATELY:   Following lower endoscopy (colonoscopy or flexible sigmoidoscopy):  Excessive amounts of blood in the stool  Significant tenderness or worsening of abdominal pains  Swelling of the abdomen that is new, acute  Fever of 100F or higher   Following upper endoscopy (EGD)  Vomiting of blood or coffee ground material  New chest pain or pain under the shoulder blades  Painful or persistently difficult swallowing  New shortness of breath  Fever of 100F or higher  Black, tarry-looking stools  For urgent or emergent issues, a gastroenterologist can be reached at any hour by calling [phone]. Do not use MyChart messaging for urgent concerns.    DIET:  We do recommend a small meal at first, but  then you may proceed to your regular diet.  Drink plenty of fluids but you should avoid alcoholic beverages for 24 hours.  ACTIVITY:  You should plan to take it easy for the rest of today and you should NOT DRIVE or use heavy machinery until tomorrow (because of the sedation medicines used during the test).    FOLLOW UP: Our staff will call the number listed on your records 48-72 hours following your procedure to check on you and address any questions or concerns that you may have regarding the information given to you following your procedure. If we do not reach you, we will leave a message.  We will attempt to reach you two times.  During this call, we will ask if you have developed any symptoms of COVID 19. If you develop any symptoms (ie: fever, flu-like symptoms, shortness of breath, cough etc.) before then, please call [phone].  If you test positive for Covid 19 in the 2 weeks post procedure, please call and report this information to us.    If any biopsies were taken you will be contacted by phone or by letter within the next 1-3 weeks.  Please call us at [phone] if you have not heard about the biopsies in 3 weeks.    SIGNATURES/CONFIDENTIALITY: You and/or your care partner have signed paperwork which will be entered into your electronic medical record.  These signatures attest to the fact that that the information above on   your After Visit Summary has been reviewed and is understood.  Full responsibility of the confidentiality of this discharge information lies with you and/or your care-partner. 

## 2020-12-03 NOTE — Progress Notes (Signed)
Called to room to assist during endoscopic procedure.  Patient ID and intended procedure confirmed with present staff. Received instructions for my participation in the procedure from the performing physician.  

## 2020-12-03 NOTE — Op Note (Signed)
Christopher Osborne Patient Name: Christopher Osborne Procedure Date: 12/03/2020 8:38 AM MRN: [id number] Endoscopist: Milus Banister , MD Age: 50 Referring MD:  Date of Birth: [birth year] Gender: Male Account #: [id number] Procedure:                Colonoscopy Indications:              Abnormal CT of the GI tract; sigmoid diverticulitis                            08/2020 Medicines:                Monitored Anesthesia Care Procedure:                Pre-Anesthesia Assessment:                           - Prior to the procedure, a History and Physical                            was performed, and patient medications and                            allergies were reviewed. The patient's tolerance of                            previous anesthesia was also reviewed. The risks                            and benefits of the procedure and the sedation                            options and risks were discussed with the patient.                            All questions were answered, and informed consent                            was obtained. Prior Anticoagulants: The patient has                            taken no previous anticoagulant or antiplatelet                            agents. ASA Grade Assessment: II - A patient with                            mild systemic disease. After reviewing the risks                            and benefits, the patient was deemed in                            satisfactory condition to undergo the procedure.  After obtaining informed consent, the colonoscope                            was passed under direct vision. Throughout the                            procedure, the patient's blood pressure, pulse, and                            oxygen saturations were monitored continuously. The                            Olympus CF-HQ190L ([id number]) Colonoscope was                            introduced through the anus and advanced to the the                             cecum, identified by appendiceal orifice and                            ileocecal valve. The colonoscopy was performed                            without difficulty. The patient tolerated the                            procedure well. The quality of the bowel                            preparation was good. The ileocecal valve,                            appendiceal orifice, and rectum were photographed. Scope In: 8:52:24 AM Scope Out: 9:20:44 AM Scope Withdrawal Time: 0 hours 20 minutes 3 seconds  Total Procedure Duration: 0 hours 28 minutes 20 seconds  Findings:                 A 10 mm polyp was found in the proximal sigmoid                            colon. The polyp was sessile. The polyp was removed                            with a hot snare. Resection and retrieval were                            complete. jar 1                           A 14 mm polyp was found in the distal sigmoid                            colon. The polyp was pedunculated. The  polyp was                            removed with a hot snare. Resection and retrieval                            were complete. jar 2                           Multiple small and large-mouthed diverticula were                            found in the left colon.                           Internal hemorrhoids were found. The hemorrhoids                            were small.                           The exam was otherwise without abnormality on                            direct and retroflexion views. Complications:            No immediate complications. Estimated blood loss:                            None. Estimated Blood Loss:     Estimated blood loss: none. Impression:               - One 10 mm polyp in the proximal sigmoid colon,                            removed with a hot snare. Resected and retrieved.                           - One 14 mm polyp in the distal sigmoid colon,                            removed  with a hot snare. Resected and retrieved.                           - Diverticulosis in the left colon.                           - Internal hemorrhoids.                           - The examination was otherwise normal on direct                            and retroflexion views. Recommendation:           - Patient has a contact number available for  emergencies. The signs and symptoms of potential                            delayed complications were discussed with the                            patient. Return to normal activities tomorrow.                            Written discharge instructions were provided to the                            patient.                           - Resume previous diet.                           - Continue present medications.                           - Await pathology results. Milus Banister, MD 12/03/2020 9:24:48 AM This report has been signed electronically.

## 2020-12-03 NOTE — Progress Notes (Signed)
Report to PACU, RN, vss, BBS= Clear.  

## 2020-12-07 ENCOUNTER — Telehealth: Payer: Self-pay | Admitting: *Deleted

## 2020-12-07 NOTE — Telephone Encounter (Signed)
  Follow up Call-  Call back number 12/03/2020 07/09/2020  Post procedure Call Back phone  # [phone]  Permission to leave phone message Yes Yes  Some recent data might be hidden     Patient questions:  Do you have a fever, pain , or abdominal swelling? No. Pain Score  0 *  Have you tolerated food without any problems? Yes.    Have you been able to return to your normal activities? Yes.    Do you have any questions about your discharge instructions: Diet   No. Medications  No. Follow up visit  No.  Do you have questions or concerns about your Care? No.  Actions: * If pain score is 4 or above: No action needed, pain <4.  1. Have you developed a fever since your procedure? no  2.   Have you had an respiratory symptoms (SOB or cough) since your procedure? no  3.   Have you tested positive for COVID 19 since your procedure no  4.   Have you had any family members/close contacts diagnosed with the COVID 19 since your procedure?  no   If yes to any of these questions please route to Joylene John, RN and Joella Prince, RN

## 2020-12-09 ENCOUNTER — Encounter: Payer: Self-pay | Admitting: Gastroenterology

## 2021-07-26 DIAGNOSIS — Z125 Encounter for screening for malignant neoplasm of prostate: Secondary | ICD-10-CM | POA: Diagnosis not present

## 2021-07-26 DIAGNOSIS — E785 Hyperlipidemia, unspecified: Secondary | ICD-10-CM | POA: Diagnosis not present

## 2021-08-02 DIAGNOSIS — Z0000 Encounter for general adult medical examination without abnormal findings: Secondary | ICD-10-CM | POA: Diagnosis not present

## 2021-08-02 DIAGNOSIS — Z1331 Encounter for screening for depression: Secondary | ICD-10-CM | POA: Diagnosis not present

## 2021-08-02 DIAGNOSIS — K2950 Unspecified chronic gastritis without bleeding: Secondary | ICD-10-CM | POA: Diagnosis not present

## 2021-08-02 DIAGNOSIS — Z1339 Encounter for screening examination for other mental health and behavioral disorders: Secondary | ICD-10-CM | POA: Diagnosis not present

## 2021-08-02 DIAGNOSIS — R7989 Other specified abnormal findings of blood chemistry: Secondary | ICD-10-CM | POA: Diagnosis not present

## 2022-02-09 DIAGNOSIS — E785 Hyperlipidemia, unspecified: Secondary | ICD-10-CM | POA: Diagnosis not present

## 2022-08-09 DIAGNOSIS — K219 Gastro-esophageal reflux disease without esophagitis: Secondary | ICD-10-CM | POA: Diagnosis not present

## 2022-08-09 DIAGNOSIS — E785 Hyperlipidemia, unspecified: Secondary | ICD-10-CM | POA: Diagnosis not present

## 2022-08-09 DIAGNOSIS — Z125 Encounter for screening for malignant neoplasm of prostate: Secondary | ICD-10-CM | POA: Diagnosis not present

## 2022-08-16 DIAGNOSIS — K2950 Unspecified chronic gastritis without bleeding: Secondary | ICD-10-CM | POA: Diagnosis not present

## 2022-08-16 DIAGNOSIS — Z0000 Encounter for general adult medical examination without abnormal findings: Secondary | ICD-10-CM | POA: Diagnosis not present

## 2022-08-16 DIAGNOSIS — Z23 Encounter for immunization: Secondary | ICD-10-CM | POA: Diagnosis not present

## 2022-08-16 DIAGNOSIS — Z1339 Encounter for screening examination for other mental health and behavioral disorders: Secondary | ICD-10-CM | POA: Diagnosis not present

## 2022-08-16 DIAGNOSIS — Z1331 Encounter for screening for depression: Secondary | ICD-10-CM | POA: Diagnosis not present

## 2023-06-15 ENCOUNTER — Other Ambulatory Visit (HOSPITAL_BASED_OUTPATIENT_CLINIC_OR_DEPARTMENT_OTHER): Payer: Self-pay

## 2023-08-14 DIAGNOSIS — Z125 Encounter for screening for malignant neoplasm of prostate: Secondary | ICD-10-CM | POA: Diagnosis not present

## 2023-08-14 DIAGNOSIS — E785 Hyperlipidemia, unspecified: Secondary | ICD-10-CM | POA: Diagnosis not present

## 2023-08-21 DIAGNOSIS — K2950 Unspecified chronic gastritis without bleeding: Secondary | ICD-10-CM | POA: Diagnosis not present

## 2023-08-21 DIAGNOSIS — Z0000 Encounter for general adult medical examination without abnormal findings: Secondary | ICD-10-CM | POA: Diagnosis not present
